# Patient Record
Sex: Male | Born: 1960 | Race: Black or African American | Hispanic: No | Marital: Single | Smoking: Never smoker
Health system: Southern US, Community
[De-identification: ages and names within clinical notes are randomized; demographics above are authoritative.]

## PROBLEM LIST (undated history)

## (undated) DIAGNOSIS — J40 Bronchitis, not specified as acute or chronic: Secondary | ICD-10-CM

## (undated) DIAGNOSIS — K219 Gastro-esophageal reflux disease without esophagitis: Secondary | ICD-10-CM

## (undated) DIAGNOSIS — M1990 Unspecified osteoarthritis, unspecified site: Secondary | ICD-10-CM

## (undated) HISTORY — DX: Unspecified osteoarthritis, unspecified site: M19.90

## (undated) HISTORY — PX: COLONOSCOPY: SHX174

## (undated) HISTORY — PX: POLYPECTOMY: SHX149

---

## 2001-12-14 HISTORY — PX: FOOT SURGERY: SHX648

## 2001-12-14 HISTORY — PX: LEG TENDON SURGERY: SHX1004

## 2004-12-14 HISTORY — PX: EYE SURGERY: SHX253

## 2012-03-07 ENCOUNTER — Encounter: Payer: Self-pay | Admitting: Family Medicine

## 2012-03-07 ENCOUNTER — Ambulatory Visit (INDEPENDENT_AMBULATORY_CARE_PROVIDER_SITE_OTHER): Payer: 59 | Admitting: Family Medicine

## 2012-03-07 VITALS — BP 122/82 | HR 72 | Temp 98.6°F | Resp 12 | Ht 70.5 in | Wt 262.0 lb

## 2012-03-07 DIAGNOSIS — Z0000 Encounter for general adult medical examination without abnormal findings: Secondary | ICD-10-CM

## 2012-03-07 MED ORDER — TADALAFIL 20 MG PO TABS
20.0000 mg | ORAL_TABLET | Freq: Every day | ORAL | Status: AC | PRN
Start: 2012-03-07 — End: 2012-04-06

## 2012-03-07 NOTE — Patient Instructions (Signed)
Consider fasting labs- CBC, BMP, Hepatic panel, TSH, Lipid panel, PSA, testosterone level Consider colonoscopy screening by age 50 and sooner if confirmed family history. Consider tetanus booster.

## 2012-03-07 NOTE — Progress Notes (Signed)
Subjective:    Patient ID: Martin Costa, male    DOB: Oct 29, 1961, 51 y.o.   MRN: [id number]  HPI  New patient to establish care for complete physical. Past medical history reviewed. 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 No prior surgeries. Does not take any regular medications. No known allergies.  Family history reviewed. Possible colon cancer father of patient not sure. Both parents had some arthritis issues but no clear history of inflammatory arthritis.  Patient is single. 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. Nonsmoker. Rare alcohol use.  Last tetanus unknown. No history of prior colonoscopy screening. Patient complains of some erectile dysfunction. Has previously used Viagra. Also has decreased libido and fatigue issues. No recent screening lab work.  Past Medical History  Diagnosis Date  . Arthritis    Past Surgical History  Procedure Date  . Foot surgery 2003    tendon repair    reports that he has never smoked. He does not have any smokeless tobacco history on file. His alcohol and drug histories not on file. family history includes Arthritis in his father and mother. No Known Allergies    Review of Systems  Constitutional: Positive for fatigue. Negative for fever, activity change, appetite change and unexpected weight change (Weight gain but not unexpected).  HENT: Negative for ear pain, congestion and trouble swallowing.   Eyes: Negative for pain and visual disturbance.  Respiratory: Negative for cough, shortness of breath and wheezing.   Cardiovascular: Negative for chest pain and palpitations.  Gastrointestinal: Negative for nausea, vomiting, abdominal pain, diarrhea, constipation, blood in stool, abdominal distention and rectal pain.  Genitourinary: Negative for dysuria, hematuria and testicular pain.  Musculoskeletal: Negative for joint swelling and arthralgias.  Skin: Negative for rash.  Neurological:  Negative for dizziness, syncope, weakness and headaches.  Hematological: Negative for adenopathy.  Psychiatric/Behavioral: Negative for confusion and dysphoric mood. The patient is not nervous/anxious.        Objective:   Physical Exam  Constitutional: He is oriented to person, place, and time. He appears well-developed and well-nourished. No distress.  HENT:  Head: Normocephalic and atraumatic.  Right Ear: External ear normal.  Left Ear: External ear normal.  Mouth/Throat: Oropharynx is clear and moist.  Eyes: Conjunctivae and EOM are normal. Pupils are equal, round, and reactive to light.  Neck: Normal range of motion. Neck supple. No thyromegaly present.  Cardiovascular: Normal rate, regular rhythm and normal heart sounds.   No murmur heard. Pulmonary/Chest: No respiratory distress. He has no wheezes. He has no rales.  Abdominal: Soft. Bowel sounds are normal. He exhibits no distension and no mass. There is no tenderness. There is no rebound and no guarding.  Genitourinary: Rectum normal and prostate normal.  Musculoskeletal: He exhibits no edema.  Lymphadenopathy:    He has no cervical adenopathy.  Neurological: He is alert and oriented to person, place, and time. He displays normal reflexes. No cranial nerve deficit.  Skin: No rash noted.  Psychiatric: He has a normal mood and affect.          Assessment & Plan:  Complete physical. Patient had recent issues with weight gain, fatigue, decreased libido and erectile dysfunction. We have recommended screening lab work. He wishes to see what insurance covers first and is also not fasting today. Consider usual labs including PSA and also testosterone at followup. Also patient given prescription for Cialis 20 mg one every other day as needed. Will need colonoscopy screening soon.  Confirm if father had colon cancer history

## 2012-12-13 ENCOUNTER — Encounter (HOSPITAL_COMMUNITY): Payer: Self-pay | Admitting: Pharmacy Technician

## 2012-12-13 ENCOUNTER — Encounter (HOSPITAL_COMMUNITY): Payer: Self-pay

## 2012-12-13 DIAGNOSIS — S129XXA Fracture of neck, unspecified, initial encounter: Secondary | ICD-10-CM | POA: Diagnosis present

## 2012-12-13 NOTE — H&P (Signed)
History of Present Illness(Martin Costa; 12/13/2012 1:49 PM) The patient is a 51 year old male who presents with neck pain. The patient reports symptoms involving the entire neck which began 6 day(s) ago. The symptoms began following a specific injury. The injury occurred due to hyperextension and blunt force trauma while the patient was while playing basketball. Symptoms include neck pain, neck stiffness, muscle spasm, shoulder pain and headaches. The pain radiates to the left chest, left shoulder, left arm, left upper arm, left forearm, right chest, right shoulder, right arm, right upper arm and right forearm. The patient describes the pain as sharp and aching. Onset was sudden.The patient describes their symptoms as severe.The patient does feel that the symptoms are worsening. Current treatment includes nonsteroidal anti-inflammatory drugs, muscle relaxants, opioid analgesics and rigid cervical collar. Past evaluation has included cervical spine CT, cervical spine MRI and neurology evaluation. The patient states that this is not a Financial risk analyst case. The patient does not have a lawsuit pending in regards to this matter.. The patient does not have an attorney representing them regarding this matter. Note for "Neck pain": He was playing basketball and was struck, he thinks by an elbow in the forehead. This sent him backwards snapping his neck. He did not lose conciousness and he did not strike his head backwards on the pavement. He was sutured in the ER and admitted overnight. He was treated with a hard collar and evaluated by a neurosurgeon. They did recommend surgery, and he wanted to stay with Dr. Shon Baton.    Subjective Transcription(Martin Heinrichs Sheela Stack, MD; 12/13/2012 5:01 PM)  REASON FOR CONSULTATION:  Injury on December 24th causing cervical fracture.    [name] is a very pleasant gentleman who has been in my practice. 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 He states he was in his usual state of health until he was playing basketball and was elbowed directly in the forehead and suffered an acute extension injury to his neck. He subsequently was seen at the hospital because of significant neck pain and was diagnosed based on CT and MRI with a fracture of his cervical spine. He was seen by the neurosurgeon there and he was told that he would require surgery. The patient opted to use be placed into an Aspen collar and presented to me today as he preferred to have me address his care.    The CT scan and MRI were evaluated. The CT scan was from 12/06/12. It does show an anterior fracture line starting at the C5-6 level proceeding through the anterior bridging osteophyte and into the inferior aspect of the C5 vertebral body and into the disc space and exiting through the posterior ossified longitudinal ligament There is also some disruption of the posterior lamina but no widening of the interspinous process ligament. No frank dislocation or perched facets are noted.    The MRI from 12/24 demonstrates fluid within the 5-6 disc space as well as fluid within the 5-6 facet joints. Again no perched or locked facet dislocation is noted. No marrow edema within the pedicle or bony facet structures to indicate posterior fracture involvement. There is on stir sequences defect within the posterior longitudinal ligament as confirmed with the CT scan.    At this point in time I do believe the patient has mild soft tissue edema within the interspinous process spaces at C3-4, C4-5, C5-6 but no edema of the actual processes themselves. There is also focal defect  of the posterior longitudinal ligament at the C5-6 level, widening of the interspinous process distance at the 5-6 level as well.    Medication History(Martin Costa; 12/13/2012 1:39 PM) Methocarbamol (750MG  Tablet, Oral three times daily) Active. No Current Medications:  Inactive. Hydrocodone-Acetaminophen (7.5-500MG  Tablet, Oral three times daily) Active. Ibuprofen (200MG  Capsule, 2 Oral two times daily, as needed) Active.   Objective Transcription(Martin Liberatore Sheela Stack, MD; 12/13/2012 5:01 PM)  On clinical exam the patient is a pleasant gentleman who appears his stated age in no acute distress. He is alert and oriented times three. He has downgoing toes on Babinski testing, no clonus. Negative Hoffmann sign. He has pain inhibition of his deltoid and biceps with strength testing bilaterally but he does have numbness and dysesthesias into the flexor surface of the forearm and into the thumb and index finger predominantly on the left side. He has cervical collar in place. He has neck pain with palpation with the collar still in place. He has no swallowing defects or difficulties. He has a significant scar that has been sutured over his forehead. No visual field defects. No shortness of breath or chest pain. Lungs are clear to auscultation. Abdomen is soft, nontender.    Assessment & Plan(Martin Costa; 12/13/2012 4:06 PM) Fracture, cervical vertebra (805.00) Current Plans l Started Xanax 0.25MG , 1 (one) Tablet PO TID PRN ANXIETY, #15, 5 days starting 12/13/2012, No Refill. 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 The patient suffered a fracture as a result of the injury on 12/24. The patient has both a bony and soft tissue component to the injury.    Plans Transcription(Martin Lavin Sheela Stack, MD; 12/13/2012 5:01 PM)  I did have a long discussion with the patient and his fiance. Had this been an all bony injury, I think he could be successfully treated with a halo brace. We did discuss how a halo was a possible form of treatment, my concern was that  since there is a soft tissue component where it goes through the disc space as evidenced by the MRI that it may not heal and it could become unstable and require surgical intervention at a later date. The other alternative is to do a posterior based fusion with lateral mass screw fixation. This would be either C3 or C4 to C7 or T1/T2. This would give proximal and distal fixation and allow for a fusion mass to maintain stability. I think this provides the best surgical option. Anteriorly I would have to dissect through the very extensive amount of the DISH which I would think would cause greater complication. At this point I think the best course of action is a posterior instrumented fusion. The risks of surgery as I have explained them to the patient and to his fiance include infection, bleeding, nerve damage, death, stroke, paralysis, failure to heal, need for further surgery, ongoing or worse pain, failure of fracture reduction and maintenance, need for halo immobilization. At this point all his questions were addressed. We will plan on surgery for this Thursday. He will continue to maintain strict Aspen collar use.

## 2012-12-14 MED ORDER — CEFAZOLIN SODIUM-DEXTROSE 2-3 GM-% IV SOLR
2.0000 g | INTRAVENOUS | Status: AC
  Administered 2012-12-15: 2 g via INTRAVENOUS
  Filled 2012-12-14 (×2): qty 50

## 2012-12-15 ENCOUNTER — Encounter (HOSPITAL_COMMUNITY): Payer: Self-pay | Admitting: *Deleted

## 2012-12-15 ENCOUNTER — Encounter (HOSPITAL_COMMUNITY): Payer: Self-pay | Admitting: Certified Registered"

## 2012-12-15 ENCOUNTER — Ambulatory Visit (HOSPITAL_COMMUNITY): Payer: BC Managed Care – PPO | Admitting: Certified Registered"

## 2012-12-15 ENCOUNTER — Encounter (HOSPITAL_COMMUNITY)
Admission: RE | Disposition: A | Discharge status: Home / Self Care | Payer: Self-pay | Source: Ambulatory Visit | Attending: Orthopedic Surgery

## 2012-12-15 ENCOUNTER — Ambulatory Visit (HOSPITAL_COMMUNITY): Payer: BC Managed Care – PPO

## 2012-12-15 ENCOUNTER — Observation Stay (HOSPITAL_COMMUNITY)
Admission: RE | Admit: 2012-12-15 | Discharge: 2012-12-18 | Disposition: A | Discharge status: Home / Self Care | Payer: BC Managed Care – PPO | Source: Ambulatory Visit | Attending: Orthopedic Surgery | Admitting: Orthopedic Surgery

## 2012-12-15 DIAGNOSIS — Y9367 Activity, basketball: Secondary | ICD-10-CM | POA: Insufficient documentation

## 2012-12-15 DIAGNOSIS — S129XXA Fracture of neck, unspecified, initial encounter: Secondary | ICD-10-CM | POA: Diagnosis present

## 2012-12-15 DIAGNOSIS — M5380 Other specified dorsopathies, site unspecified: Principal | ICD-10-CM | POA: Insufficient documentation

## 2012-12-15 DIAGNOSIS — Y9239 Other specified sports and athletic area as the place of occurrence of the external cause: Secondary | ICD-10-CM | POA: Insufficient documentation

## 2012-12-15 DIAGNOSIS — S12400A Unspecified displaced fracture of fifth cervical vertebra, initial encounter for closed fracture: Secondary | ICD-10-CM | POA: Insufficient documentation

## 2012-12-15 DIAGNOSIS — IMO0002 Reserved for concepts with insufficient information to code with codable children: Secondary | ICD-10-CM | POA: Insufficient documentation

## 2012-12-15 DIAGNOSIS — Y92838 Other recreation area as the place of occurrence of the external cause: Secondary | ICD-10-CM | POA: Insufficient documentation

## 2012-12-15 HISTORY — DX: Bronchitis, not specified as acute or chronic: J40

## 2012-12-15 HISTORY — DX: Gastro-esophageal reflux disease without esophagitis: K21.9

## 2012-12-15 HISTORY — PX: POSTERIOR CERVICAL FUSION/FORAMINOTOMY: SHX5038

## 2012-12-15 LAB — CBC
HCT: 40.6 % (ref 39.0–52.0)
Hemoglobin: 14 g/dL (ref 13.0–17.0)
MCV: 89.2 fL (ref 78.0–100.0)
RDW: 14.1 % (ref 11.5–15.5)
WBC: 7.6 10*3/uL (ref 4.0–10.5)

## 2012-12-15 LAB — BASIC METABOLIC PANEL
BUN: 21 mg/dL (ref 6–23)
CO2: 24 mEq/L (ref 19–32)
Chloride: 103 mEq/L (ref 96–112)
Creatinine, Ser: 1.15 mg/dL (ref 0.50–1.35)
Glucose, Bld: 81 mg/dL (ref 70–99)

## 2012-12-15 SURGERY — POSTERIOR CERVICAL FUSION/FORAMINOTOMY LEVEL 3
Anesthesia: General | Site: Spine Cervical | Wound class: Clean

## 2012-12-15 MED ORDER — METHOCARBAMOL 100 MG/ML IJ SOLN
500.0000 mg | Freq: Four times a day (QID) | INTRAVENOUS | Status: DC | PRN
  Administered 2012-12-15: 500 mg via INTRAVENOUS

## 2012-12-15 MED ORDER — ACETAMINOPHEN 10 MG/ML IV SOLN
1000.0000 mg | Freq: Once | INTRAVENOUS | Status: AC
  Administered 2012-12-15: 1000 mg via INTRAVENOUS

## 2012-12-15 MED ORDER — COCAINE HCL 4 % EX SOLN
4.0000 mL | Freq: Once | CUTANEOUS | Status: AC
  Administered 2012-12-15: 4 mL via NASAL

## 2012-12-15 MED ORDER — FENTANYL CITRATE 0.05 MG/ML IJ SOLN
INTRAMUSCULAR | Status: AC
  Filled 2012-12-15: qty 2

## 2012-12-15 MED ORDER — LACTATED RINGERS IV SOLN
INTRAVENOUS | Status: DC | PRN
  Administered 2012-12-15 (×2): via INTRAVENOUS

## 2012-12-15 MED ORDER — LACTATED RINGERS IV SOLN
INTRAVENOUS | Status: DC
  Administered 2012-12-15: 14:00:00 via INTRAVENOUS

## 2012-12-15 MED ORDER — ONDANSETRON HCL 4 MG/2ML IJ SOLN: 4.0000 mg | Freq: Four times a day (QID) | INTRAMUSCULAR | Status: DC | PRN

## 2012-12-15 MED ORDER — PHENYLEPHRINE HCL 10 MG/ML IJ SOLN
10.0000 mg | INTRAVENOUS | Status: DC | PRN
  Administered 2012-12-15: 10 ug/min via INTRAVENOUS

## 2012-12-15 MED ORDER — 0.9 % SODIUM CHLORIDE (POUR BTL) OPTIME
TOPICAL | Status: DC | PRN
  Administered 2012-12-15: 1000 mL

## 2012-12-15 MED ORDER — PROPOFOL 10 MG/ML IV BOLUS
INTRAVENOUS | Status: DC | PRN
  Administered 2012-12-15: 30 mg via INTRAVENOUS
  Administered 2012-12-15: 200 mg via INTRAVENOUS

## 2012-12-15 MED ORDER — BACITRACIN ZINC 500 UNIT/GM EX OINT
TOPICAL_OINTMENT | CUTANEOUS | Status: AC
  Filled 2012-12-15: qty 15

## 2012-12-15 MED ORDER — PROPOFOL INFUSION 10 MG/ML OPTIME
INTRAVENOUS | Status: DC | PRN
  Administered 2012-12-15: 100 ug/kg/min via INTRAVENOUS
  Administered 2012-12-15: 17:00:00 via INTRAVENOUS

## 2012-12-15 MED ORDER — ACETAMINOPHEN 10 MG/ML IV SOLN
INTRAVENOUS | Status: AC
  Filled 2012-12-15: qty 100

## 2012-12-15 MED ORDER — MUPIROCIN 2 % EX OINT
TOPICAL_OINTMENT | Freq: Two times a day (BID) | CUTANEOUS | Status: DC
  Administered 2012-12-15: 1 via NASAL
  Administered 2012-12-16: 04:00:00 via NASAL

## 2012-12-15 MED ORDER — HYDROMORPHONE HCL PF 1 MG/ML IJ SOLN
INTRAMUSCULAR | Status: DC | PRN
  Administered 2012-12-15: .4 mg via INTRAVENOUS
  Administered 2012-12-15: .6 mg via INTRAVENOUS

## 2012-12-15 MED ORDER — LACTATED RINGERS IV SOLN
INTRAVENOUS | Status: DC
Start: 2012-12-15 — End: 2012-12-18

## 2012-12-15 MED ORDER — OXYMETAZOLINE HCL 0.05 % NA SOLN
NASAL | Status: AC
  Filled 2012-12-15: qty 15

## 2012-12-15 MED ORDER — THROMBIN 20000 UNITS EX SOLR
CUTANEOUS | Status: DC | PRN
  Administered 2012-12-15: 17:00:00 via TOPICAL

## 2012-12-15 MED ORDER — DIPHENHYDRAMINE HCL 12.5 MG/5ML PO ELIX: 12.5000 mg | ORAL_SOLUTION | Freq: Four times a day (QID) | ORAL | Status: DC | PRN

## 2012-12-15 MED ORDER — DEXTROSE 5 % IV SOLN
500.0000 mg | INTRAVENOUS | Status: DC
  Filled 2012-12-15: qty 5

## 2012-12-15 MED ORDER — VANCOMYCIN HCL 500 MG IV SOLR
INTRAVENOUS | Status: DC | PRN
  Administered 2012-12-15: 500 mg

## 2012-12-15 MED ORDER — HYDROMORPHONE 0.3 MG/ML IV SOLN
INTRAVENOUS | Status: AC
  Filled 2012-12-15: qty 25

## 2012-12-15 MED ORDER — THROMBIN 20000 UNITS EX SOLR
CUTANEOUS | Status: AC
  Filled 2012-12-15: qty 20000

## 2012-12-15 MED ORDER — MIDAZOLAM HCL 2 MG/2ML IJ SOLN
INTRAMUSCULAR | Status: AC
  Filled 2012-12-15: qty 2

## 2012-12-15 MED ORDER — MIDAZOLAM HCL 5 MG/5ML IJ SOLN
INTRAMUSCULAR | Status: DC | PRN
  Administered 2012-12-15 (×2): 1 mg via INTRAVENOUS

## 2012-12-15 MED ORDER — DIPHENHYDRAMINE HCL 50 MG/ML IJ SOLN: 12.5000 mg | Freq: Four times a day (QID) | INTRAMUSCULAR | Status: DC | PRN

## 2012-12-15 MED ORDER — DEXAMETHASONE SODIUM PHOSPHATE 10 MG/ML IJ SOLN
10.0000 mg | Freq: Once | INTRAMUSCULAR | Status: AC
  Administered 2012-12-15: 10 mg via INTRAVENOUS
  Filled 2012-12-15 (×2): qty 1

## 2012-12-15 MED ORDER — ACETAMINOPHEN 10 MG/ML IV SOLN
1000.0000 mg | Freq: Four times a day (QID) | INTRAVENOUS | Status: AC
  Administered 2012-12-16 (×4): 1000 mg via INTRAVENOUS
  Filled 2012-12-15 (×4): qty 100

## 2012-12-15 MED ORDER — VANCOMYCIN HCL 500 MG IV SOLR
INTRAVENOUS | Status: AC
  Filled 2012-12-15: qty 500

## 2012-12-15 MED ORDER — SODIUM CHLORIDE 0.9 % IJ SOLN: 9.0000 mL | INTRAMUSCULAR | Status: DC | PRN

## 2012-12-15 MED ORDER — COCAINE HCL 4 % EX SOLN
CUTANEOUS | Status: AC
  Filled 2012-12-15: qty 4

## 2012-12-15 MED ORDER — BUPIVACAINE-EPINEPHRINE 0.25% -1:200000 IJ SOLN
INTRAMUSCULAR | Status: AC
  Filled 2012-12-15: qty 1

## 2012-12-15 MED ORDER — FENTANYL CITRATE 0.05 MG/ML IJ SOLN
50.0000 ug | Freq: Once | INTRAMUSCULAR | Status: AC
  Administered 2012-12-15: 50 ug via INTRAVENOUS

## 2012-12-15 MED ORDER — MIDAZOLAM HCL 2 MG/2ML IJ SOLN
1.0000 mg | INTRAMUSCULAR | Status: DC | PRN
  Administered 2012-12-15: 1 mg via INTRAVENOUS

## 2012-12-15 MED ORDER — METHOCARBAMOL 500 MG PO TABS
500.0000 mg | ORAL_TABLET | Freq: Four times a day (QID) | ORAL | Status: DC | PRN
  Administered 2012-12-16 (×2): 500 mg via ORAL
  Filled 2012-12-15 (×3): qty 1

## 2012-12-15 MED ORDER — BUPIVACAINE-EPINEPHRINE 0.25% -1:200000 IJ SOLN
INTRAMUSCULAR | Status: DC | PRN
  Administered 2012-12-15: 10 mL

## 2012-12-15 MED ORDER — FENTANYL CITRATE 0.05 MG/ML IJ SOLN
INTRAMUSCULAR | Status: DC | PRN
  Administered 2012-12-15: 50 ug via INTRAVENOUS
  Administered 2012-12-15: 100 ug via INTRAVENOUS
  Administered 2012-12-15 (×3): 50 ug via INTRAVENOUS
  Administered 2012-12-15: 100 ug via INTRAVENOUS
  Administered 2012-12-15 (×2): 50 ug via INTRAVENOUS

## 2012-12-15 MED ORDER — MUPIROCIN 2 % EX OINT
TOPICAL_OINTMENT | CUTANEOUS | Status: AC
  Administered 2012-12-15: 1 via NASAL
  Filled 2012-12-15: qty 22

## 2012-12-15 MED ORDER — HYDROMORPHONE HCL PF 1 MG/ML IJ SOLN
INTRAMUSCULAR | Status: AC
  Filled 2012-12-15: qty 1

## 2012-12-15 MED ORDER — HYDROMORPHONE 0.3 MG/ML IV SOLN
INTRAVENOUS | Status: DC
  Administered 2012-12-15: 21:00:00 via INTRAVENOUS

## 2012-12-15 MED ORDER — NALOXONE HCL 0.4 MG/ML IJ SOLN: 0.4000 mg | INTRAMUSCULAR | Status: DC | PRN

## 2012-12-15 SURGICAL SUPPLY — 83 items
BIT DRILL MOUNTAINEER 16MM (BIT) ×1 IMPLANT
BIT DRILL MOUNTAINEER FIX 14 (BIT) ×1
BIT DRILL MOUNTAINEER FIX 14MM (BIT) ×1 IMPLANT
BIT DRILL NEURO 2X3.1 SFT TUCH (MISCELLANEOUS) ×2 IMPLANT
BLADE SURG ROTATE 9660 (MISCELLANEOUS) IMPLANT
BONE CHIP PRESERV 20CC (Bone Implant) ×2 IMPLANT
BUR SURG 4X8 MED (BURR) ×1 IMPLANT
BURR SURG 4X8 MED (BURR) ×2
CLOSURE STERI-STRIP 1/4X4 (GAUZE/BANDAGES/DRESSINGS) ×2 IMPLANT
CLOTH BEACON ORANGE TIMEOUT ST (SAFETY) ×2 IMPLANT
CORDS BIPOLAR (ELECTRODE) ×2 IMPLANT
COVER MAYO STAND STRL (DRAPES) ×6 IMPLANT
COVER SURGICAL LIGHT HANDLE (MISCELLANEOUS) ×4 IMPLANT
DERMABOND ADVANCED (GAUZE/BANDAGES/DRESSINGS) ×1
DERMABOND ADVANCED .7 DNX12 (GAUZE/BANDAGES/DRESSINGS) ×1 IMPLANT
DRAPE C-ARM 42X72 X-RAY (DRAPES) ×4 IMPLANT
DRAPE POUCH INSTRU U-SHP 10X18 (DRAPES) ×2 IMPLANT
DRAPE SURG 17X23 STRL (DRAPES) ×2 IMPLANT
DRAPE U-SHAPE 47X51 STRL (DRAPES) ×2 IMPLANT
DRILL BIT MOUNTAINEER 16MM (BIT) ×2
DRILL BIT MOUNTAINEER FIX 14MM (BIT) ×1
DRILL NEURO 2X3.1 SOFT TOUCH (MISCELLANEOUS) ×4
DRSG ADAPTIC 3X8 NADH LF (GAUZE/BANDAGES/DRESSINGS) ×2 IMPLANT
DRSG MEPILEX BORDER 4X8 (GAUZE/BANDAGES/DRESSINGS) ×2 IMPLANT
DURAPREP 26ML APPLICATOR (WOUND CARE) ×2 IMPLANT
ELECT BLADE 4.0 EZ CLEAN MEGAD (MISCELLANEOUS)
ELECT BLADE 6.5 EXT (BLADE) ×2 IMPLANT
ELECT CAUTERY BLADE 6.4 (BLADE) ×2 IMPLANT
ELECT REM PT RETURN 9FT ADLT (ELECTROSURGICAL) ×2
ELECTRODE BLDE 4.0 EZ CLN MEGD (MISCELLANEOUS) IMPLANT
ELECTRODE REM PT RTRN 9FT ADLT (ELECTROSURGICAL) ×1 IMPLANT
EVACUATOR 1/8 PVC DRAIN (DRAIN) IMPLANT
GLOVE BIOGEL PI IND STRL 6.5 (GLOVE) ×1 IMPLANT
GLOVE BIOGEL PI IND STRL 8.5 (GLOVE) ×1 IMPLANT
GLOVE BIOGEL PI INDICATOR 6.5 (GLOVE) ×1
GLOVE BIOGEL PI INDICATOR 8.5 (GLOVE) ×1
GLOVE ECLIPSE 6.0 STRL STRAW (GLOVE) ×2 IMPLANT
GLOVE ECLIPSE 8.5 STRL (GLOVE) ×2 IMPLANT
GOWN PREVENTION PLUS XXLARGE (GOWN DISPOSABLE) ×2 IMPLANT
GOWN STRL NON-REIN LRG LVL3 (GOWN DISPOSABLE) ×4 IMPLANT
IV CATH 14GX2 1/4 (CATHETERS) IMPLANT
KIT BASIN OR (CUSTOM PROCEDURE TRAY) ×2 IMPLANT
KIT POSITION SURG JACKSON T1 (MISCELLANEOUS) ×2 IMPLANT
KIT ROOM TURNOVER OR (KITS) ×2 IMPLANT
KIT STIMULAN RAPID CURE 5CC (Orthopedic Implant) ×2 IMPLANT
NEEDLE 22X1 1/2 (OR ONLY) (NEEDLE) ×2 IMPLANT
NS IRRIG 1000ML POUR BTL (IV SOLUTION) ×2 IMPLANT
NUT HH X CONN OUTER (Orthopedic Implant) ×4 IMPLANT
PACK LAMINECTOMY ORTHO (CUSTOM PROCEDURE TRAY) ×2 IMPLANT
PACK UNIVERSAL I (CUSTOM PROCEDURE TRAY) ×2 IMPLANT
PAD ARMBOARD 7.5X6 YLW CONV (MISCELLANEOUS) ×4 IMPLANT
PATTIES SURGICAL .5 X.5 (GAUZE/BANDAGES/DRESSINGS) IMPLANT
PATTIES SURGICAL .5 X1 (DISPOSABLE) ×2 IMPLANT
PIN MAYFIELD SKULL DISP (PIN) ×2 IMPLANT
PLATE HH X CONN 28MM (Plate) ×2 IMPLANT
PUTTY OP1 (Orthopedic Implant) ×2 IMPLANT
ROD MOUTAINEER 3.5X200MM (Rod) ×2 IMPLANT
SCREW F A 3.5X14 (Screw) ×12 IMPLANT
SCREW HH X CONN INNER (Screw) ×4 IMPLANT
SCREW INNER (Screw) ×16 IMPLANT
SCREW M L 3.5X14 (Screw) ×4 IMPLANT
SCREW MOUNTAINEER M/L 4.35X25 (Screw) ×4 IMPLANT
SPONGE GAUZE 4X4 12PLY (GAUZE/BANDAGES/DRESSINGS) ×2 IMPLANT
SPONGE LAP 4X18 X RAY DECT (DISPOSABLE) ×4 IMPLANT
STAPLER VISISTAT (STAPLE) ×2 IMPLANT
STRIP CLOSURE SKIN 1/2X4 (GAUZE/BANDAGES/DRESSINGS) ×4 IMPLANT
SURGIFLO TRUKIT (HEMOSTASIS) ×2 IMPLANT
SURGILUBE 2OZ TUBE FLIPTOP (MISCELLANEOUS) IMPLANT
SUT ETHILON 3 0 FSL (SUTURE) IMPLANT
SUT PDS AB 1 CTX 36 (SUTURE) ×2 IMPLANT
SUT PROLENE 0 CT (SUTURE) ×2 IMPLANT
SUT PROLENE 2 0 CT2 30 (SUTURE) ×2 IMPLANT
SUT VIC AB 0 CTB1 27 (SUTURE) ×4 IMPLANT
SUT VIC AB 1 CTX 36 (SUTURE) ×3
SUT VIC AB 1 CTX36XBRD ANBCTR (SUTURE) ×3 IMPLANT
SUT VIC AB 2-0 CT1 18 (SUTURE) ×2 IMPLANT
SYR BULB IRRIGATION 50ML (SYRINGE) ×2 IMPLANT
SYR CONTROL 10ML LL (SYRINGE) ×2 IMPLANT
TOWEL OR 17X24 6PK STRL BLUE (TOWEL DISPOSABLE) ×2 IMPLANT
TOWEL OR 17X26 10 PK STRL BLUE (TOWEL DISPOSABLE) ×2 IMPLANT
TRAY FOLEY CATH 14FR (SET/KITS/TRAYS/PACK) ×2 IMPLANT
WATER STERILE IRR 1000ML POUR (IV SOLUTION) ×2 IMPLANT
YANKAUER SUCT BULB TIP NO VENT (SUCTIONS) ×2 IMPLANT

## 2012-12-15 NOTE — Transfer of Care (Signed)
Immediate Anesthesia Transfer of Care Note  Patient: Martin Costa  Procedure(s) Performed: Procedure(s) (LRB) with comments: POSTERIOR CERVICAL FUSION/FORAMINOTOMY LEVEL 3 (N/A) - POSTERIOR CERVICAL FUSION CERVICAL FOUR TO THORACIC TWO  Patient Location: PACU  Anesthesia Type:General  Level of Consciousness: sedated  Airway & Oxygen Therapy: Patient Spontanous Breathing and Patient connected to face mask oxygen  Post-op Assessment: Report given to PACU RN and Post -op Vital signs reviewed and stable  Post vital signs: Reviewed and stable  Complications: No apparent anesthesia complications

## 2012-12-15 NOTE — H&P (Signed)
No change in clinical exam Plan on C4-T1/T2 fusion. H+P reviewed

## 2012-12-15 NOTE — Anesthesia Preprocedure Evaluation (Addendum)
Anesthesia Evaluation  Patient identified by MRN, date of birth, ID band Patient awake    Reviewed: H&P , NPO status , Patient's Chart, lab work & pertinent test results  Airway Mallampati: II  Neck ROM: Limited  Mouth opening: Limited Mouth Opening  Dental  (+) Teeth Intact and Dental Advisory Given   Pulmonary  breath sounds clear to auscultation        Cardiovascular Rhythm:Regular Rate:Normal     Neuro/Psych    GI/Hepatic   Endo/Other    Renal/GU      Musculoskeletal   Abdominal   Peds  Hematology   Anesthesia Other Findings   Reproductive/Obstetrics                          Anesthesia Physical Anesthesia Plan  ASA: II  Anesthesia Plan: General   Post-op Pain Management:    Induction: Intravenous  Airway Management Planned: Nasal ETT, Awake Intubation Planned and Fiberoptic Intubation Planned  Additional Equipment:   Intra-op Plan:   Post-operative Plan: Extubation in OR  Informed Consent: I have reviewed the patients History and Physical, chart, labs and discussed the procedure including the risks, benefits and alternatives for the proposed anesthesia with the patient or authorized representative who has indicated his/her understanding and acceptance.   Dental advisory given  Plan Discussed with: CRNA and Surgeon  Anesthesia Plan Comments: (52 year old male with DISH and fracture at C5-6, neuro intact with potential unstable C-spine. Plan awake nasal fiberoptic intubation. Procedures explained, questions answered.  Kipp Brood, MD)      Anesthesia Quick Evaluation

## 2012-12-15 NOTE — Brief Op Note (Signed)
12/15/2012  7:58 PM  PATIENT:  Martin Costa  52 y.o. male  PRE-OPERATIVE DIAGNOSIS:  C 4-7 CERVICAL FRACTURE  POST-OPERATIVE DIAGNOSIS:  C 4-7 CERVICAL FRACTURE  PROCEDURE:  Procedure(s) (LRB) with comments: POSTERIOR CERVICAL FUSION/FORAMINOTOMY LEVEL 3 (N/A) - POSTERIOR CERVICAL FUSION CERVICAL FOUR TO THORACIC TWO  SURGEON:  Surgeon(s) and Role:    * Venita Lick, MD - Primary  PHYSICIAN ASSISTANT:   ASSISTANTS: none   ANESTHESIA:   general  EBL:     BLOOD ADMINISTERED:none  DRAINS: none   LOCAL MEDICATIONS USED:  MARCAINE     SPECIMEN:  No Specimen  DISPOSITION OF SPECIMEN:  N/A  COUNTS:  YES  TOURNIQUET:  * No tourniquets in log *  DICTATION: .Other Dictation: Dictation Number [id number]  PLAN OF CARE: Admit to inpatient   PATIENT DISPOSITION:  PACU - hemodynamically stable.

## 2012-12-15 NOTE — Preoperative (Signed)
Beta Blockers   Reason not to administer Beta Blockers:Not Applicable 

## 2012-12-15 NOTE — Anesthesia Postprocedure Evaluation (Signed)
  Anesthesia Post-op Note  Patient: Martin Costa  Procedure(s) Performed: Procedure(s) (LRB) with comments: POSTERIOR CERVICAL FUSION/FORAMINOTOMY LEVEL 3 (N/A) - POSTERIOR CERVICAL FUSION CERVICAL FOUR TO THORACIC TWO  Patient Location: PACU  Anesthesia Type:General  Level of Consciousness: awake, sedated and patient cooperative  Airway and Oxygen Therapy: Patient Spontanous Breathing  Post-op Pain: mild  Post-op Assessment: Post-op Vital signs reviewed, Patient's Cardiovascular Status Stable, Respiratory Function Stable, Patent Airway, No signs of Nausea or vomiting and Pain level controlled  Post-op Vital Signs: stable  Complications: No apparent anesthesia complications

## 2012-12-16 ENCOUNTER — Observation Stay (HOSPITAL_COMMUNITY): Payer: BC Managed Care – PPO

## 2012-12-16 MED ORDER — DIAZEPAM 2 MG PO TABS
2.0000 mg | ORAL_TABLET | Freq: Three times a day (TID) | ORAL | Status: DC | PRN
  Administered 2012-12-16 – 2012-12-18 (×4): 2 mg via ORAL
  Filled 2012-12-16 (×4): qty 1

## 2012-12-16 MED ORDER — OXYCODONE HCL 5 MG PO TABS
10.0000 mg | ORAL_TABLET | ORAL | Status: DC | PRN
  Administered 2012-12-16: 10 mg via ORAL
  Filled 2012-12-16: qty 2

## 2012-12-16 MED ORDER — SODIUM CHLORIDE 0.9 % IV SOLN: 250.0000 mL | INTRAVENOUS | Status: DC

## 2012-12-16 MED ORDER — DEXAMETHASONE 4 MG PO TABS
4.0000 mg | ORAL_TABLET | Freq: Four times a day (QID) | ORAL | Status: DC
  Administered 2012-12-16 – 2012-12-18 (×10): 4 mg via ORAL
  Filled 2012-12-16 (×13): qty 1

## 2012-12-16 MED ORDER — ONDANSETRON HCL 4 MG/2ML IJ SOLN: 4.0000 mg | INTRAMUSCULAR | Status: DC | PRN

## 2012-12-16 MED ORDER — CEFAZOLIN SODIUM 1-5 GM-% IV SOLN
1.0000 g | Freq: Three times a day (TID) | INTRAVENOUS | Status: AC
  Administered 2012-12-16 (×2): 1 g via INTRAVENOUS
  Filled 2012-12-16 (×2): qty 50

## 2012-12-16 MED ORDER — DEXAMETHASONE SODIUM PHOSPHATE 4 MG/ML IJ SOLN
4.0000 mg | Freq: Four times a day (QID) | INTRAMUSCULAR | Status: DC
  Filled 2012-12-16 (×13): qty 1

## 2012-12-16 MED ORDER — FLEET ENEMA 7-19 GM/118ML RE ENEM: 1.0000 | ENEMA | Freq: Once | RECTAL | Status: AC | PRN

## 2012-12-16 MED ORDER — OXYCODONE HCL 5 MG PO TABS
10.0000 mg | ORAL_TABLET | ORAL | Status: DC | PRN
  Administered 2012-12-16 – 2012-12-18 (×10): 10 mg via ORAL
  Filled 2012-12-16 (×10): qty 2

## 2012-12-16 MED ORDER — MENTHOL 3 MG MT LOZG: 1.0000 | LOZENGE | OROMUCOSAL | Status: DC | PRN

## 2012-12-16 MED ORDER — OXYCODONE HCL ER 15 MG PO T12A
15.0000 mg | EXTENDED_RELEASE_TABLET | Freq: Two times a day (BID) | ORAL | Status: DC
  Administered 2012-12-16 – 2012-12-18 (×5): 15 mg via ORAL
  Filled 2012-12-16 (×5): qty 1

## 2012-12-16 MED ORDER — ZOLPIDEM TARTRATE 5 MG PO TABS
5.0000 mg | ORAL_TABLET | Freq: Every evening | ORAL | Status: DC | PRN
  Administered 2012-12-16: 5 mg via ORAL
  Filled 2012-12-16: qty 1

## 2012-12-16 MED ORDER — PHENOL 1.4 % MT LIQD: 1.0000 | OROMUCOSAL | Status: DC | PRN

## 2012-12-16 MED ORDER — SODIUM CHLORIDE 0.9 % IJ SOLN
3.0000 mL | Freq: Two times a day (BID) | INTRAMUSCULAR | Status: DC
  Administered 2012-12-16 (×2): 3 mL via INTRAVENOUS

## 2012-12-16 MED ORDER — DOCUSATE SODIUM 100 MG PO CAPS
100.0000 mg | ORAL_CAPSULE | Freq: Two times a day (BID) | ORAL | Status: DC
  Administered 2012-12-16 – 2012-12-18 (×5): 100 mg via ORAL
  Filled 2012-12-16 (×7): qty 1

## 2012-12-16 MED ORDER — SODIUM CHLORIDE 0.9 % IJ SOLN: 3.0000 mL | INTRAMUSCULAR | Status: DC | PRN

## 2012-12-16 NOTE — Progress Notes (Signed)
Orthopedic Tech Progress Note Patient Details:  Martin Costa 1961-10-20 [id number] Brace order completed by bio-tech vendor Joey at 3:43 pm. Patient ID: [name], male   DOB: 27-Nov-1961, 53 y.o.   MRN: [id number]   Jennye Moccasin 12/16/2012, 4:26 PM

## 2012-12-16 NOTE — Progress Notes (Signed)
Orthopedic Tech Progress Note Patient Details:  Martin Costa October 12, 1961 [id number] Called bio-tech for aspen collar. Patient ID: Mohab Ashby, male   DOB: 02-22-1961, 52 y.o.   MRN: [id number]   Jennye Moccasin 12/16/2012, 3:41 PM

## 2012-12-16 NOTE — Evaluation (Signed)
Physical Therapy Evaluation Patient Details Name: Martin Costa MRN: [id number] DOB: 27-May-1961 Today's Date: 12/16/2012 Time: 1040-1110 PT Time Calculation (min): 30 min  PT Assessment / Plan / Recommendation Clinical Impression  52 yo male s/p C4-T1 posterior fusion that would benefit from PT in acute setting to maximize functional mobility for safe d/c to home.   Pt mobility limited by pain at this time.  Pt pain mostly in bilateral upper traps.  Pt also presents with edema in bilateral upper traps and tenderness to light palpation.  Applied ice packs to both shoulders and ordered gel ice pack from OR.  Acute PT to follow pt.       PT Assessment  Patient needs continued PT services    Follow Up Recommendations  Home health PT;Supervision - Intermittent    Does the patient have the potential to tolerate intense rehabilitation      Barriers to Discharge None      Equipment Recommendations  Rolling walker with 5" wheels    Recommendations for Other Services     Frequency Min 5X/week    Precautions / Restrictions Precautions Precautions: Cervical Precaution Comments: Educated pt on cervical precautions and use of hard collar.  Pt reports wanting to keep collar on all the time for fear of injuring his neck. Attempted to reassure pt that sleeping in brace was not necessary.   Required Braces or Orthoses: Cervical Brace Cervical Brace: Hard collar;Applied in supine position Restrictions Weight Bearing Restrictions: No   Pertinent Vitals/Pain 10/10 pain in bilateral Upper traps. Pt medicated 30+min prior to session. Applied ice to bilateral shoulders for pain and edema reduction.       Mobility  Bed Mobility Bed Mobility: Sit to Supine Supine to Sit: 3: Mod assist;HOB elevated Sitting - Scoot to Edge of Bed: 4: Min assist Sit to Supine: 5: Supervision;HOB flat Details for Bed Mobility Assistance: Attempted to instruct pt in sit-side-supine transfer, but pt unable to tolerate. Pt  transitioned to supine from semi-sidelying position.  Pt able to complete task without assistance but c/o severe pain.  Transfers Transfers: Sit to Stand;Stand to [employer] Transfers Sit to Stand: 4: Min guard;From chair/3-in-1;With upper extremity assist Stand to Sit: 4: Min guard;To bed;With upper extremity assist Stand Pivot Transfers: 5: Supervision Details for Transfer Assistance: educated on hand placement Ambulation/Gait Ambulation/Gait Assistance: Not tested (comment) General Gait Details: Pt declined secondary to pain.      Shoulder Instructions     Exercises     PT Diagnosis: Acute pain;Generalized weakness  PT Problem List: Decreased strength;Decreased activity tolerance;Decreased mobility;Decreased knowledge of use of DME;Decreased knowledge of precautions;Pain PT Treatment Interventions: Gait training;Stair training;Functional mobility training;Therapeutic activities;Therapeutic exercise;DME instruction;Patient/family education   PT Goals Acute Rehab PT Goals PT Goal Formulation: With patient Time For Goal Achievement: 12/23/12 Potential to Achieve Goals: Good Pt will Roll Supine to Right Side: with modified independence PT Goal: Rolling Supine to Right Side - Progress: Goal set today Pt will go Supine/Side to Sit: with modified independence PT Goal: Supine/Side to Sit - Progress: Goal set today Pt will go Sit to Supine/Side: with modified independence PT Goal: Sit to Supine/Side - Progress: Goal set today Pt will go Sit to Stand: with modified independence PT Goal: Sit to Stand - Progress: Goal set today Pt will go Stand to Sit: with modified independence PT Goal: Stand to Sit - Progress: Goal set today Pt will Ambulate: 51 - 150 feet;with supervision;with least restrictive assistive device PT Goal: Ambulate - Progress:  Goal set today Pt will Go Up / Down Stairs: Flight;with supervision;with least restrictive assistive device PT Goal: Up/Down Stairs -  Progress: Goal set today Additional Goals Additional Goal #1: Pt will recall and demonstrate 3/3 cervical precautions.  PT Goal: Additional Goal #1 - Progress: Goal set today  Visit Information  Last PT Received On: 12/16/12 Assistance Needed: +1    Subjective Data  Subjective: It's not going to happen today. I am in too much pain and sleepy Patient Stated Goal: none stated.    Prior Functioning  Home Living Lives With: Alone Available Help at Discharge: Family Type of Home: Apartment Home Access: Stairs to enter Secretary/administrator of Steps: 15 Entrance Stairs-Rails: Can reach both Home Layout: One level Bathroom Shower/Tub: Forensic scientist: Standard Bathroom Accessibility: Yes How Accessible: Accessible via walker Home Adaptive Equipment: None Prior Function Level of Independence: Independent Able to Take Stairs?: Yes Driving: Yes Vocation: Full time employment Comments: Proofreader -  Communication Communication: No difficulties Dominant Hand: Right    Cognition  Overall Cognitive Status: Appears within functional limits for tasks assessed/performed Arousal/Alertness: Awake/alert Orientation Level: Appears intact for tasks assessed Behavior During Session: [facility] for tasks performed    Extremity/Trunk Assessment Right Upper Extremity Assessment RUE ROM/Strength/Tone: Deficits;Due to pain RUE ROM/Strength/Tone Deficits: ARom ~30 degrees shoulder flexion, 10 degrees abduction shoulder RUE Sensation: WFL - Light Touch RUE Coordination: WFL - gross motor Left Upper Extremity Assessment LUE ROM/Strength/Tone: Deficits;Due to pain LUE ROM/Strength/Tone Deficits: ARom ~30 degrees shoulder flexion, 10 degrees abduction shoulder LUE Sensation: WFL - Light Touch LUE Coordination: WFL - gross motor Right Lower Extremity Assessment RLE ROM/Strength/Tone: Within functional levels RLE Sensation: WFL - Light Touch;WFL - Proprioception Left Lower  Extremity Assessment LLE ROM/Strength/Tone: Within functional levels LLE Sensation: WFL - Light Touch;WFL - Proprioception Trunk Assessment Trunk Assessment: Normal   Balance Balance Balance Assessed: Yes Static Sitting Balance Static Sitting - Balance Support: Feet supported Static Sitting - Level of Assistance: 7: Independent Static Standing Balance Static Standing - Balance Support: Bilateral upper extremity supported Static Standing - Level of Assistance: 5: Stand by assistance Static Standing - Comment/# of Minutes: 1 min with no LOB with UE support.   End of Session PT - End of Session Equipment Utilized During Treatment: Gait belt;Cervical collar Activity Tolerance: Patient limited by pain;Patient limited by fatigue Patient left: in bed;with call bell/phone within reach Nurse Communication: Mobility status  GP Functional Assessment Tool Used: clinical judgement Functional Limitation: Mobility: Walking and moving around;Changing and maintaining body position Mobility: Walking and Moving Around Current Status [phone]): At least 40 percent but less than 60 percent impaired, limited or restricted Mobility: Walking and Moving Around Goal Status [phone]): At least 1 percent but less than 20 percent impaired, limited or restricted Changing and Maintaining Body Position Current Status (Q6578): At least 40 percent but less than 60 percent impaired, limited or restricted Changing and Maintaining Body Position Goal Status (I6962): At least 1 percent but less than 20 percent impaired, limited or restricted   Samary Shatz 12/16/2012, 12:16 PM  Kariss Longmire L. Ramiel Forti DPT [phone]

## 2012-12-16 NOTE — Progress Notes (Signed)
CARE MANAGEMENT NOTE 12/16/2012  Patient:  Martin Costa, Martin Costa   Account Number:  [id number]  Date Initiated:  12/16/2012  Documentation initiated by:  Vance Peper  Subjective/Objective Assessment:   52 yr old male s/p C4-T1 fusion     Action/Plan:   No home health needs identified.   Anticipated DC Date:  12/17/2012   Anticipated DC Plan:  HOME/SELF CARE      DC Planning Services  CM consult      PAC Choice  NA   Choice offered to / List presented to:             Status of service:  Completed, signed off Medicare Important Message given?   (If response is "NO", the following Medicare IM given date fields will be blank) Date Medicare IM given:   Date Additional Medicare IM given:    Discharge Disposition:  HOME/SELF CARE  Per UR Regulation:    If discussed at Long Length of Stay Meetings, dates discussed:    Comments:

## 2012-12-16 NOTE — Progress Notes (Signed)
     Subjective: Procedure(s) (LRB): POSTERIOR CERVICAL FUSION/FORAMINOTOMY LEVEL 3 (N/A) 1 Day Post-Op  Patient reports pain as 5 on 0-10 scale.  Reports decreased arm pain reports incisional neck pain   Positive void Negative bowel movement Positive flatus Negative chest pain or shortness of breath  Objective: Vital signs in last 24 hours: Temp:  [97.6 F (36.4 C)-98.6 F (37 C)] 98.6 F (37 C) (01/03 0328) Pulse Rate:  [58-102] 102  (01/03 0328) Resp:  [12-20] 16  (01/03 0328) BP: (142-163)/(83-100) 156/88 mmHg (01/03 0328) SpO2:  [100 %] 100 % (01/03 0328)  Intake/Output from previous day: 01/02 0701 - 01/03 0700 In: 3580 [P.O.:1560; I.V.:2020] Out: 3350 [Urine:3150; Blood:200]  Labs:  [facility] 12/15/12 1255  WBC 7.6  RBC 4.55  HCT 40.6  PLT 326    Basename 12/15/12 1255  NA 139  K 4.5  CL 103  CO2 24  BUN 21  CREATININE 1.15  GLUCOSE 81  CALCIUM 9.4    Basename 12/15/12 1255  LABPT --  INR 1.16    Physical Exam: Neurologically intact ABD soft Neurovascular intact Incision: dressing C/D/I No cellulitis present Compartment soft no SOB/CP  Assessment/Plan: Patient stable  xrays hardware satisfactory.  fx reduction satisfactory Mobilization with physical therapy Encourage incentive spirometry Continue care  Advance diet Up with therapy Plan for discharge tomorrow Discharge home with home health Will adjust pain medications today  Venita Lick, MD [facility] [phone]

## 2012-12-16 NOTE — Progress Notes (Signed)
Referral received for SNF. Chart reviewed and CSW has spoken with RNCM who indicates that patient is for DC to home with Home Health and DME.  CSW to sign off. Please re-consult if CSW needs arise.  Deandrea Vanpelt T. [place] Maiorino, LCSWA  [phone]  

## 2012-12-16 NOTE — Progress Notes (Signed)
Utilization review completed. Khristie Sak, RN, BSN. 

## 2012-12-16 NOTE — Progress Notes (Signed)
Physical Therapy Treatment Patient Details Name: Martin Costa MRN: [id number] DOB: [birth year] Today's Date: 12/16/2012 Time: 6295-2841 PT Time Calculation (min): 25 min  PT Assessment / Plan / Recommendation Comments on Treatment Session  52 y.o. male POD #1 C4-T1 posterior cervical fusion.  He called PT ready to attempt walking this afternoon due to pain better managed.  He is only lightly using RW for balance and support today.  I anticipate that he will not need one for discharge home, but this depends on his progress.      Follow Up Recommendations  Home health PT;Supervision - Intermittent     Does the patient have the potential to tolerate intense rehabilitation    NA  Barriers to Discharge   none      Equipment Recommendations  Rolling walker with 5" wheels;Other (comment) (he may not need RW-TBD depends on progress)    Recommendations for Other Services   none  Frequency Min 5X/week   Plan Discharge plan remains appropriate;Frequency remains appropriate    Precautions / Restrictions Precautions Precautions: Cervical Precaution Comments: reviewed log roll for protection of neck during bed mobility and supine <-> sit transitions.  Required Braces or Orthoses: Cervical Brace Cervical Brace: Hard collar;Applied in supine position   Pertinent Vitals/Pain 6/10 posterior neck pain, radiating into bil shoulders/upper traps.  Pt reports he has had pain meds.  Ice packs applied after session.      Mobility  Bed Mobility Rolling Right: 6: Modified independent (Device/Increase time);With rail Right Sidelying to Sit: 4: Min assist;HOB elevated;With rails Sitting - Scoot to Edge of Bed: 6: Modified independent (Device/Increase time);With rail Sit to Supine: 6: Modified independent (Device/Increase time);HOB flat;With rail Details for Bed Mobility Assistance: pt relied heavily on bed rail and HOB being elevated.  Will need to practice without these assistive devices before going home.     Transfers Sit to Stand: 5: Supervision;With upper extremity assist;From bed Stand to Sit: 5: Supervision;With upper extremity assist;To bed Details for Transfer Assistance: safe hand placement.   Ambulation/Gait Ambulation/Gait Assistance: 5: Supervision Ambulation Distance (Feet): 150 Feet Assistive device: Rolling walker Ambulation/Gait Assistance Details: verbal cues for upright posture and light lean on the hands.  I anticipate that he will not need RW for discharge home, but this is yet TBD.  Gait Pattern: Step-through pattern;Trunk flexed      PT Goals Acute Rehab PT Goals Pt will Roll Supine to Right Side: with modified independence;Other (comment) (no rail) PT Goal: Rolling Supine to Right Side - Progress: Updated due to goal met Pt will go Supine/Side to Sit: with modified independence;with HOB 0 degrees;Other (comment) (no rail) PT Goal: Supine/Side to Sit - Progress: Updated due to goal met Pt will go Sit to Supine/Side: with modified independence;with HOB 0 degrees;Other (comment) (no rail) PT Goal: Sit to Supine/Side - Progress: Updated due to goal met PT Goal: Sit to Stand - Progress: Progressing toward goal PT Goal: Stand to Sit - Progress: Progressing toward goal Pt will Ambulate: >150 feet;with modified independence PT Goal: Ambulate - Progress: Updated due to goal met  Visit Information  Last PT Received On: 12/16/12 Assistance Needed: +1    Subjective Data  Subjective: Pt reports he feels   Cognition  Overall Cognitive Status: Appears within functional limits for tasks assessed/performed    Balance  Static Standing Balance Static Standing - Balance Support: Right upper extremity supported;No upper extremity supported;During functional activity Static Standing - Level of Assistance: 5: Stand by assistance Static Standing -  Comment/# of Minutes: while toileting standing up  End of Session PT - End of Session Equipment Utilized During Treatment: Cervical  collar Activity Tolerance: Patient limited by fatigue;Patient limited by pain Patient left: in bed;with call bell/phone within reach       [place] B. Priyal Musquiz, PT, DPT [phone]   12/16/2012, 3:49 PM

## 2012-12-16 NOTE — Evaluation (Signed)
Occupational Therapy Evaluation Patient Details Name: Martin Costa MRN: [id number] DOB: 16-Jan-1961 Today's Date: 12/16/2012 Time: 4098-1191 OT Time Calculation (min): 28 min  OT Assessment / Plan / Recommendation Clinical Impression  52 yo male s/p C4-T1 posterior fusion that could benefit from skilled OT acutely. Recommend HHOT for d/c planning. pt currently limited by pain    OT Assessment  Patient needs continued OT Services    Follow Up Recommendations  Home health OT    Barriers to Discharge Decreased caregiver support fiance in town for weekend then patient will d/c home alone to a second floor apartment  Equipment Recommendations  None recommended by OT    Recommendations for Other Services    Frequency  Min 2X/week    Precautions / Restrictions Precautions Precautions: Cervical Required Braces or Orthoses: Cervical Brace Cervical Brace: Hard collar;Applied in supine position   Pertinent Vitals/Pain Severe 10 out 10 pain Pt reports pain in bil shoulders worse than neck pain Pt repositioned in chair with pillows to elevate UE to decrease pain RN Cary arriving with pain medication Pt reports that medication is not helping and that the PCA did not help but was doing a better job. Pt is anxious about not being able to self administer medication and waiting on RN to bring medication.    ADL  Eating/Feeding: Minimal assistance Where Assessed - Eating/Feeding: Chair Grooming: Wash/dry face;Teeth care;Minimal assistance Where Assessed - Grooming: Supported standing Upper Body Dressing: Moderate assistance Where Assessed - Upper Body Dressing: Unsupported sitting Lower Body Dressing: +1 Total assistance Where Assessed - Lower Body Dressing: Supine, head of bed up Toilet Transfer: Minimal assistance Toilet Transfer Method: Sit to stand Toilet Transfer Equipment: Regular height toilet Equipment Used: Gait belt;Rolling walker Transfers/Ambulation Related to ADLs: pt  ambulating to sink ~6 ft with Min (A). Pt fall risk at this time  ADL Comments: pt agreeable to OOB for oral care. pt educated brace and pad change. Pt with previous experience wearing brace PTA. Pt familiar with don Shirlean Schlein. Pt required support of Rt UE with oral care due to pain. Pt completed oral care with Mod (A) Rt Ue and setup tooth brush/paste. Pt returned to sitting s/p oral care. pt with 10 out 10 pain in bil shoulders    OT Diagnosis: Acute pain;Generalized weakness  OT Problem List: Decreased strength;Decreased activity tolerance;Decreased safety awareness;Decreased knowledge of use of DME or AE;Decreased knowledge of precautions;Pain OT Treatment Interventions: Self-care/ADL training;Neuromuscular education;DME and/or AE instruction;Therapeutic activities;Patient/family education;Balance training   OT Goals Acute Rehab OT Goals OT Goal Formulation: With patient Time For Goal Achievement: 12/30/12 Potential to Achieve Goals: Good ADL Goals Pt Will Perform Grooming: with set-up;Standing at sink ADL Goal: Grooming - Progress: Goal set today Pt Will Perform Upper Body Bathing: with set-up;Standing at sink ADL Goal: Upper Body Bathing - Progress: Goal set today Pt Will Perform Lower Body Bathing: with set-up;Sit to stand from chair ADL Goal: Lower Body Bathing - Progress: Goal set today Pt Will Perform Upper Body Dressing: with set-up;Sit to stand from chair ADL Goal: Upper Body Dressing - Progress: Goal set today Pt Will Perform Lower Body Dressing: with set-up;Sit to stand from chair;with adaptive equipment (AE PRN) ADL Goal: Lower Body Dressing - Progress: Goal set today Pt Will Transfer to Toilet: with set-up;Ambulation;Regular height toilet ADL Goal: Toilet Transfer - Progress: Goal set today  Visit Information  Last OT Received On: 12/16/12 Assistance Needed: +1    Subjective Data  Subjective: "its not my neck its my  shoulders" "whatever they are giving me isn't working.  those muscle relaxers are doing nothing" Patient Stated Goal: to get pain under control   Prior Functioning     Home Living Lives With: Alone Available Help at Discharge: Family Type of Home: Apartment Home Access: Stairs to enter Secretary/administrator of Steps: 15 Entrance Stairs-Rails: Can reach both Home Layout: One level Bathroom Shower/Tub: Forensic scientist: Standard Bathroom Accessibility: Yes How Accessible: Accessible via walker Home Adaptive Equipment: None Prior Function Level of Independence: Independent Able to Take Stairs?: Yes Driving: Yes Vocation: Full time employment Comments: Proofreader -  Communication Communication: No difficulties Dominant Hand: Right         Vision/Perception     Cognition  Overall Cognitive Status: Appears within functional limits for tasks assessed/performed Arousal/Alertness: Awake/alert Orientation Level: Appears intact for tasks assessed Behavior During Session: [facility] for tasks performed    Extremity/Trunk Assessment Right Upper Extremity Assessment RUE ROM/Strength/Tone: Deficits;Due to pain RUE ROM/Strength/Tone Deficits: ARom ~30 degrees shoulder flexion, 10 degrees abduction shoulder RUE Sensation: WFL - Light Touch RUE Coordination: WFL - gross motor Left Upper Extremity Assessment LUE ROM/Strength/Tone: Deficits;Due to pain LUE ROM/Strength/Tone Deficits: ARom ~30 degrees shoulder flexion, 10 degrees abduction shoulder LUE Sensation: WFL - Light Touch LUE Coordination: WFL - gross motor Trunk Assessment Trunk Assessment: Normal     Mobility Bed Mobility Bed Mobility: Supine to Sit;Sitting - Scoot to Edge of Bed Supine to Sit: 3: Mod assist;HOB elevated Sitting - Scoot to Edge of Bed: 4: Min assist Details for Bed Mobility Assistance: Pt attempting to long sit initially with severe pain. pt educated on log roll onto Rt side to increase independence with bed mobility. Pt able to push up  on Rt elbow with severe pain Transfers Transfers: Stand to Sit;Sit to Stand Sit to Stand: 4: Min assist;With upper extremity assist;From bed Stand to Sit: 4: Min assist;With upper extremity assist;To chair/3-in-1 Details for Transfer Assistance: educated on hand placement                    End of Session OT - End of Session Activity Tolerance: Patient limited by pain Patient left: in chair;with call bell/phone within reach Nurse Communication: Mobility status;Precautions  GO Functional Assessment Tool Used: clinical judgement Functional Limitation: Self care Self Care Current Status (Z6109): At least 40 percent but less than 60 percent impaired, limited or restricted Self Care Goal Status (U0454): At least 40 percent but less than 60 percent impaired, limited or restricted   Lucile Shutters 12/16/2012, 11:09 AM Pager: [phone]

## 2012-12-16 NOTE — Op Note (Signed)
Martin Costa, Martin Costa NO.:  [id number]  MEDICAL RECORD NO.:  [id number]  LOCATION:  [id number]                        FACILITY:  [facility]  PHYSICIAN:  Alvy Beal, MD    DATE OF BIRTH:  [birth year]  DATE OF PROCEDURE:  12/15/2012 DATE OF DISCHARGE:                              OPERATIVE REPORT   PREOPERATIVE DIAGNOSES: 1. C5 fracture. 2. Diffuse idiopathic skeletal hyperostosis (DISH).  POSTOPERATIVE DIAGNOSES: 1. C5 fracture. 2. Diffuse idiopathic skeletal hyperostosis (DISH).  OPERATIVE PROCEDURE:  Posterior cervical spine fusion C4 to thoracic 1.  INSTRUMENTATION SYSTEM USED:  DePuy Mountaineer lateral mass screw fixation system with 14 mm lateral mass screws placed at C4, C5, C6, C7, and 25 mm pedicle screws placed at T1.  COMPLICATIONS:  None.  CONDITION:  Stable.  HISTORY:  This is a very pleasant gentleman, who I saw back in September and was diagnosed with DISH of the cervical thoracic spine.  I informed the patient that he was at risk with any type of hyperextension or hyperflexion motion of the head that he could suffer a cervical fracture.  Last week December 24th, he was playing basketball with friends.  He was struck in the forehead by an elbow, hyperextended his neck and had immediate neck pain.  He went to the emergency room.  He was diagnosed with fracture through the anterior osteophytes and into the disk space at C5-6.  The patient elected to return here to [facility] to have me perform definitive treatment.  Preoperatively, the patient was grossly neurologically intact.  He did have some left C6 numbness and dysesthesias but for the most part, he was neurologically intact with motor strength testing.  After discussing treatment options, we elected to proceed with posterior spinal fusion. All appropriate risks, benefits, and alternatives were discussed with the patient and consent was obtained.  OPERATIVE NOTE:  The patient was brought  to the operating room, placed supine on the operating table.  After successful induction of general anesthesia and endotracheal intubation, TEDs, SCDs, and a Foley were inserted.  All appropriate evoked motor sensory, evoked motor potential monitors, SSEP, and free running EMG monitors were placed by the NeuroStim representative.  Once this was done, a Engineer, manufacturing systems were placed.  They were placed appropriately into the lateral side of the hip and torqued down appropriately.  The patient was then secured to the [facility] table with the Mayfield tong locked in place.  The spine frame was then placed over the patient and secured into position.  He was then rotated into the prone position.  The arms were taped at the side and padded and all bony prominences were well padded.  The head and neck were properly secured in the Mayfield tongs.  The back of the neck was then prepped and draped in a standard fashion. Appropriate time-out was done to confirm patient, procedure, and all other pertinent important data.  A midline incision was started at the level of the C3 spinous process and carried down to the level of T2 spinous process.  Sharp dissection was carried out in the midline down to the deep fascia.  The deep fascia was  sharply incised and using bipolar electrocautery obtained hemostasis.  Then using electrocautery and a Cobb elevator, I stripped the paraspinal muscles to expose the posterior aspect of the spine.  I dissected down to expose the lamina and the lateral mass and facet complex.  I exposed C4, C5, C6, C7, and T1 and a portion of that at T2. Once this was done bilaterally, I used bipolar electrocautery to obtain hemostasis.  I then removed the spinous process tips for bone graft later on.  At C4 identified midportion of the lateral mass and placed my awl in the inferior medial quadrant.  I used a high-speed bur to break through the cortical bone and then a  drill.  The drill was aimed cephalad and lateral and x-ray guidance was used to confirm satisfactory trajectory of the drill.  I then tapped the hole, probed the hole again and confirmed I had solid bony bridge.  I then placed a 14 mm DePuy Mountaineer lateral mass screw at that level.  I then repeated this at C5, C6, and C7 bilaterally once I had the lateral mass screws properly positioned.  I then elected to place a T1 thoracic pedicle screw.  I felt as if I had good purchase with these screws.  I could stop at T1. If not, I would go to T2.  I then used x-ray to identify the lateral wall of the pedicle, and I used a high-speed bur to decorticate the bone.  I then used my awl to advance into the pedicle.  Once I was approximately 15 mm in depth, I then slightly adjusted my trajectory so I would go more medial and I went into the vertebral body.  I confirmed satisfactory position and alignment in both planes.  I then tapped, probed with a ball-tip Feeler, confirming I had a solid bony canal.  I then placed a 25 mm length pedicle screw 4.35 diameter into the T1 pedicle.  Both sides I had excellent purchase.  At this point, I then contoured 2 rods and placed them into the wound and secured them in place.  I did slightly compress the fracture site.  All locking nuts were then properly torqued.  I had excellent fixation.  At this point, a transcortical motor evoked potentials were done.  They showed no change from preincision.  At this point, I used a high-speed bur to decorticate the remaining portion of the lamina and the facet complex at C4-5, C5-6, C6-7, C7-T1 and a portion of that at T2.  Once this was done, I placed OP1 at the C5-6 level and then I placed the remaining bone graft that I had taken from autograft bone from harvest and then mixed this with cortical cancellous chips.  Prior to placing the bone graft, it should be noted that I copiously irrigated with almost a liter of fluid.   With the bone graft in place, I then placed cross-link at the C5 level for added stability.  Once this was completed, I then placed antibiotic impregnated methylmethacrylate beads to decrease the chance of postoperative wound infection.  I then closed the deep fascia with interrupted #1 Vicryl sutures, then a layer of 2-0 Vicryl sutures, and then I used #2 Prolene running vertical mattress suture to close the skin.  Dry dressings were applied.  The flat Jean Rosenthal table was then repositioned over the patient and he was then rotated back into the supine position.  The Mayfield tongs were removed and an Aspen collar was reapplied.  The patient had a laceration over the forehead that was sutured approximately a week and a half ago and so these were removed and Steri-Strips were applied.  The patient was then awoken, extubated, and transferred to the PACU without incident.  At the end of the case, all needle and sponge counts were correct.     Alvy Beal, MD    DDB/MEDQ  D:  12/15/2012  T:  12/16/2012  Job:  [id number]

## 2012-12-17 NOTE — Progress Notes (Signed)
Occupational Therapy Treatment Patient Details Name: Martin Costa MRN: [id number] DOB: March 16, 1961 Today's Date: 12/17/2012 Time: 4401-0272 OT Time Calculation (min): 59 min  OT Assessment / Plan / Recommendation Comments on Treatment Session Pt still with significant shoulder pain, but neck pain overall reduced.  Able to exhibit approximately 80 degrees of bilateral shoulder flexion for grooming tasks.  Overall modified independent with mobility in the room and to the sink.  Has been educated on AE and techniques to follow precautions.  Will have initial assistance from his girlfriend at discharge but will mostly be by himslef during the day.  Recommend HHOT eval for safety.    Follow Up Recommendations  Home health OT       Equipment Recommendations  None recommended by OT       Frequency Min 2X/week   Plan Discharge plan remains appropriate    Precautions / Restrictions Precautions Precautions: Cervical Required Braces or Orthoses: Cervical Brace Cervical Brace: Hard collar;Applied in sitting position Restrictions Weight Bearing Restrictions: No   Pertinent Vitals/Pain Pain 7/10 in his shoulders, has ice packs and pain meds given earlier    ADL  Grooming: Performed;Wash/dry hands;Wash/dry face;Teeth care;Supervision/safety Where Assessed - Grooming: Unsupported standing Lower Body Dressing: Performed;Minimal assistance Where Assessed - Lower Body Dressing: Unsupported sit to stand Toilet Transfer: Performed;Modified independent Toilet Transfer Method: Other (comment) (ambulate without assistive device to regular toilet) Toilet Transfer Equipment: Regular height toilet Toileting - Clothing Manipulation and Hygiene: Simulated;Independent Where Assessed - Toileting Clothing Manipulation and Hygiene: Sit to stand from 3-in-1 or toilet Tub/Shower Transfer: Performed;Modified independent Tub/Shower Transfer Method: Ambulating Equipment Used: Other (comment) (cervical collar at all  times) Transfers/Ambulation Related to ADLs: Pt overall modified indepedent with mobility, just moving at a slightly slower rate than normal, but no assistive device needed. ADL Comments: Pt able to perform shoulder flexion to appproximately 80 degrees for grooming tasks this morning without physical assist.  Educated on AE available to assist with LB selfcare including sockaide, reacher, LH sponge, and LH shoe horn.  Recommended use of reacher secondary to pt having difficulty seeing his feet for donning pants.  Has second collar for showeirng as well.  Also discussed type of UB clothing that should ber worn to make it easier to perform his dressing.      OT Goals ADL Goals ADL Goal: Grooming - Progress: Met ADL Goal: Lower Body Dressing - Progress: Progressing toward goals ADL Goal: Toilet Transfer - Progress: Met  Visit Information  Last OT Received On: 12/17/12 Assistance Needed: +1    Subjective Data  Subjective: If my shoulders weren't hurting so bad I could do more. Patient Stated Goal: To not be in as much pain.      Cognition  Overall Cognitive Status: Appears within functional limits for tasks assessed/performed Arousal/Alertness: Awake/alert Orientation Level: Appears intact for tasks assessed Behavior During Session: [name] for tasks performed    Mobility  Shoulder Instructions Bed Mobility Bed Mobility: Rolling Right Rolling Right: 6: Modified independent (Device/Increase time);With rail Right Sidelying to Sit: 6: Modified independent (Device/Increase time);With rails Transfers Transfers: Sit to Stand Sit to Stand: 6: Modified independent (Device/Increase time);Without upper extremity assist;From bed;From toilet Stand to Sit: 6: Modified independent (Device/Increase time);Without upper extremity assist;To chair/3-in-1          Balance Balance Balance Assessed: Yes Static Standing Balance Static Standing - Balance Support: No upper extremity supported Static  Standing - Level of Assistance: 6: Modified independent (Device/Increase time) Dynamic Standing Balance Dynamic Standing -  Balance Support: No upper extremity supported Dynamic Standing - Level of Assistance: 6: Modified independent (Device/Increase time)   End of Session OT - End of Session Equipment Utilized During Treatment: Cervical collar Activity Tolerance: Patient tolerated treatment well Patient left: in chair;with family/visitor present Nurse Communication: Mobility status     Martin Costa OTR/L Pager number [phone] 12/17/2012, 10:33 AM

## 2012-12-17 NOTE — Progress Notes (Signed)
Physical Therapy Treatment Patient Details Name: Martin Costa MRN: [id number] DOB: Jun 07, 1961 Today's Date: 12/17/2012 Time: 8657-8469 PT Time Calculation (min): 25 min  PT Assessment / Plan / Recommendation Comments on Treatment Session  Pt making great progress with mobility and at functional level safe for discharge home. Completed stair education today.    Follow Up Recommendations  Home health PT;Supervision - Intermittent           Equipment Recommendations  None recommended by PT       Frequency Min 5X/week   Plan Discharge plan remains appropriate;Frequency remains appropriate    Precautions / Restrictions Precautions Precautions: Cervical Required Braces or Orthoses: Cervical Brace Cervical Brace: Hard collar;Other (comment) (pt with collar on upon arrival to room) Restrictions Weight Bearing Restrictions: No    Pertinent Vitals/Pain No complaints of pain with session today, pt premedicated before therapy session.    Mobility  Bed Mobility Bed Mobility: Rolling Right Rolling Right: 6: Modified independent (Device/Increase time);With rail Right Sidelying to Sit: 6: Modified independent (Device/Increase time);With rails Transfers Sit to Stand: 6: Modified independent (Device/Increase time);From chair/3-in-1;With upper extremity assist;With armrests Stand to Sit: 6: Modified independent (Device/Increase time);To chair/3-in-1;With upper extremity assist;With armrests Details for Transfer Assistance: no cues or assistance needed, increased time needed. Ambulation/Gait Ambulation/Gait Assistance: 6: Modified independent (Device/Increase time) Ambulation Distance (Feet): 400 Feet Assistive device: None Ambulation/Gait Assistance Details: no cues or assistance needed with gait. Gait Pattern: Step-through pattern;Decreased stride length Stairs: Yes Stairs Assistance: 5: Supervision Stairs Assistance Details (indicate cue type and reason): cues for posture. Pt ascended  stairs with alternating pattern and descended stairs with step to pattern Stair Management Technique: One rail Left;Alternating pattern;Step to pattern Number of Stairs: 10       PT Goals Acute Rehab PT Goals PT Goal: Sit to Stand - Progress: Met PT Goal: Stand to Sit - Progress: Met PT Goal: Ambulate - Progress: Met PT Goal: Up/Down Stairs - Progress: Met Additional Goals PT Goal: Additional Goal #1 - Progress: Met  Visit Information  Last PT Received On: 12/17/12 Assistance Needed: +1    Subjective Data  Subjective: No new compliants, agreeable to therapy today.   Cognition  Overall Cognitive Status: Appears within functional limits for tasks assessed/performed Arousal/Alertness: Awake/alert Orientation Level: Appears intact for tasks assessed Behavior During Session: [facility] for tasks performed    Balance  Balance Balance Assessed: Yes Static Standing Balance Static Standing - Balance Support: No upper extremity supported Static Standing - Level of Assistance: 6: Modified independent (Device/Increase time) Dynamic Standing Balance Dynamic Standing - Balance Support: No upper extremity supported Dynamic Standing - Level of Assistance: 6: Modified independent (Device/Increase time)  End of Session PT - End of Session Equipment Utilized During Treatment: Gait belt;Cervical collar Activity Tolerance: Patient tolerated treatment well Patient left: in chair;with call bell/phone within reach Nurse Communication: Mobility status;Patient requests pain meds   GP     Sallyanne Kuster 12/17/2012, 12:14 PM  Sallyanne Kuster, PTA Office- [phone]

## 2012-12-17 NOTE — Progress Notes (Signed)
Patient ID: Martin Costa, male   DOB: September 13, 1961, 52 y.o.   MRN: [id number] Subjective: 2 Days Post-Op Procedure(s) (LRB): POSTERIOR CERVICAL FUSION/FORAMINOTOMY LEVEL 3 (N/A)    Patient reports pain as moderate but mainly in his shoulders, not as much in neck or back at this point particularly after change of meds to valium  Objective:   VITALS:   Filed Vitals:   12/17/12 0559  BP: 144/90  Pulse: 87  Temp: 98.6 F (37 C)  Resp: 20    Neurovascular intact Incision: dressing C/D/I, dressing changed  LABS  Basename 12/15/12 1255  HGB 14.0  HCT 40.6  WBC 7.6  PLT 326     Basename 12/15/12 1255  NA 139  K 4.5  BUN 21  CREATININE 1.15  GLUCOSE 81     Basename 12/15/12 1255  LABPT --  INR 1.16     Assessment/Plan: 2 Days Post-Op Procedure(s) (LRB): POSTERIOR CERVICAL FUSION/FORAMINOTOMY LEVEL 3 (N/A)   Advance diet Up with therapy Plan for discharge tomorrow  Reviewed questions and concerns that he had Dr. Shon Baton on call tomorrow to make ultimate plan for D/C Will most like;y need [facility] nursing for dressing changes and home PT for initial activity clearance

## 2012-12-18 DIAGNOSIS — S129XXA Fracture of neck, unspecified, initial encounter: Secondary | ICD-10-CM | POA: Diagnosis present

## 2012-12-18 MED ORDER — OXYCODONE HCL ER 15 MG PO T12A: 15.0000 mg | EXTENDED_RELEASE_TABLET | Freq: Two times a day (BID) | ORAL | Status: DC

## 2012-12-18 MED ORDER — PHENOL 1.4 % MT LIQD: 1.0000 | OROMUCOSAL | Status: DC | PRN

## 2012-12-18 MED ORDER — DIAZEPAM 2 MG PO TABS: 2.0000 mg | ORAL_TABLET | Freq: Three times a day (TID) | ORAL | Status: DC | PRN

## 2012-12-18 MED ORDER — OXYCODONE HCL 10 MG PO TABS: 10.0000 mg | ORAL_TABLET | ORAL | Status: DC | PRN

## 2012-12-18 MED ORDER — ONDANSETRON HCL 4 MG PO TABS: 4.0000 mg | ORAL_TABLET | Freq: Three times a day (TID) | ORAL | Status: DC | PRN

## 2012-12-18 MED ORDER — DOCUSATE SODIUM 100 MG PO CAPS: 100.0000 mg | ORAL_CAPSULE | Freq: Two times a day (BID) | ORAL | Status: DC

## 2012-12-18 NOTE — Progress Notes (Signed)
Physical Therapy Treatment Patient Details Name: Martin Costa MRN: [id number] DOB: 1961-08-17 Today's Date: 12/18/2012 Time: 7829-5621 PT Time Calculation (min): 13 min  PT Assessment / Plan / Recommendation Comments on Treatment Session  Pt progressing great today with most goals met.    Follow Up Recommendations  Home health PT;Supervision - Intermittent           Equipment Recommendations  None recommended by PT       Frequency Min 5X/week   Plan Discharge plan remains appropriate;Frequency remains appropriate    Precautions / Restrictions Precautions Precautions: Cervical Cervical Brace: Hard collar    Pertinent Vitals/Pain Reports some pain (not rated). RN notified and pain medicine given prior to gait/stairs with PT.    Mobility  Transfers Sit to Stand: 7: Independent;From chair/3-in-1;With upper extremity assist;With armrests Stand to Sit: 7: Independent;To chair/3-in-1;With upper extremity assist;With armrests Details for Transfer Assistance: no cues/assit/or incr time needed. Ambulation/Gait Ambulation/Gait Assistance: 7: Independent Ambulation Distance (Feet): 400 Feet Assistive device: None Gait Pattern: Within Functional Limits Stairs Assistance: 6: Modified independent (Device/Increase time) Stair Management Technique: One rail Left;Forwards;Alternating pattern Number of Stairs: 12       PT Goals Acute Rehab PT Goals PT Goal: Sit to Stand - Progress: Met PT Goal: Stand to Sit - Progress: Met PT Goal: Ambulate - Progress: Met PT Goal: Up/Down Stairs - Progress: Met Additional Goals PT Goal: Additional Goal #1 - Progress: Met  Visit Information  Last PT Received On: 12/18/12 Assistance Needed: +1    Subjective Data  Subjective: No new complaints, ready to go home. Agreeable to PT at this time.   Cognition  Overall Cognitive Status: Appears within functional limits for tasks assessed/performed Arousal/Alertness: Awake/alert Orientation Level:  Appears intact for tasks assessed Behavior During Session: [facility] for tasks performed       End of Session PT - End of Session Equipment Utilized During Treatment: Gait belt;Cervical collar Activity Tolerance: Patient tolerated treatment well Patient left: in chair;with call bell/phone within reach;with family/visitor present Nurse Communication: Mobility status   GP     Sallyanne Kuster 12/18/2012, 10:31 AM  Sallyanne Kuster, PTA Office- [phone]

## 2012-12-18 NOTE — Progress Notes (Signed)
Faxed [facility] orders and facesheet for [facility] RN MWF dressing changes. Contacted pt with [facility] contact info and soc 12/19/2011. Isidoro Donning RN CCM Case Mgmt phone [phone]

## 2012-12-18 NOTE — Progress Notes (Signed)
NCM spoke to pt on cell number and explained [facility] RN not available for qd and that he would need someone teachable in home to do dressing changes. States he can have someone come on T, Th, Sat and Sunday. He will just need [facility] RN on MWF for dressing changes. NCM contacted [facility] and they can do MWF. Will need modification in orders. Isidoro Donning RN CCM Case Mgmt phone [phone]

## 2012-12-18 NOTE — Progress Notes (Signed)
NCM spoke to pt and explained call to [facility] for [facility] approval for soc 1/6. NCM contacted [facility] and they will have to speak with administration to approve qd dressing changes and verify coverage with his [employer]. Pt dc home. Pt requested NCM contact him on cell phone. Isidoro Donning RN CCM Case Mgmt phone [phone]

## 2012-12-18 NOTE — Discharge Summary (Signed)
Patient ID: Helmut Hennon MRN: [id number] DOB/AGE: 08-05-1961 52 y.o.  Admit date: 12/15/2012 Discharge date: 12/18/2012  Admission Diagnoses:  Principal Problem:  *Cervical spine fracture   Discharge Diagnoses:  Principal Problem:  *Cervical spine fracture  status post Procedure(s): POSTERIOR CERVICAL FUSION/FORAMINOTOMY LEVEL 3  Past Medical History  Diagnosis Date  . Arthritis   . Bronchitis     hx of  . GERD (gastroesophageal reflux disease)     hx of    Surgeries: Procedure(s): POSTERIOR CERVICAL FUSION/FORAMINOTOMY LEVEL 3 on 12/15/2012   Consultants:  none  Discharged Condition: Improved  Hospital Course: Govind Furey is an 52 y.o. male who was admitted 12/15/2012 for operative treatment of Cervical spine fracture. Patient failed conservative treatments (please see the history and physical for the specifics) and had severe unremitting pain that affects sleep, daily activities and work/hobbies. After pre-op clearance, the patient was taken to the operating room on 12/15/2012 and underwent  Procedure(s): POSTERIOR CERVICAL FUSION/FORAMINOTOMY LEVEL 3.    Patient was given perioperative antibiotics: Anti-infectives     Start     Dose/Rate Route Frequency Ordered Stop   12/16/12 0348   ceFAZolin (ANCEF) IVPB 1 g/50 mL premix        1 g 100 mL/hr over 30 Minutes Intravenous Every 8 hours 12/16/12 0348 12/16/12 1306   12/15/12 1717   vancomycin (VANCOCIN) powder  Status:  Discontinued          As needed 12/15/12 1718 12/15/12 2018   12/14/12 2004   ceFAZolin (ANCEF) IVPB 2 g/50 mL premix        2 g 100 mL/hr over 30 Minutes Intravenous 30 min pre-op 12/14/12 2004 12/15/12 1620           Patient was given sequential compression devices and early ambulation to prevent DVT.   Patient benefited maximally from hospital stay and there were no complications. At the time of discharge, the patient was urinating/moving their bowels without difficulty, tolerating a regular  diet, pain is controlled with oral pain medications and they have been cleared by PT/OT.   Recent vital signs: Patient Vitals for the past 24 hrs:  BP Temp Pulse Resp SpO2  12/18/12 0712 155/64 mmHg 98.7 F (37.1 C) 80  20  98 %  12/17/12 2217 150/62 mmHg 98.9 F (37.2 C) 85  20  99 %  12/17/12 1400 157/68 mmHg 98.9 F (37.2 C) 98  20  100 %     Recent laboratory studies:  Basename 12/15/12 1255  WBC 7.6  HGB 14.0  HCT 40.6  PLT 326  NA 139  K 4.5  CL 103  CO2 24  BUN 21  CREATININE 1.15  GLUCOSE 81  INR 1.16  CALCIUM 9.4     Discharge Medications:     Medication List     As of 12/18/2012  8:19 AM    ASK your doctor about these medications         HYDROcodone-acetaminophen 7.5-500 MG per tablet   Commonly known as: LORTAB   Take 1 tablet by mouth 3 (three) times daily as needed. For pain      ibuprofen 200 MG tablet   Commonly known as: ADVIL,MOTRIN   Take 600 mg by mouth daily as needed. For pain      methocarbamol 750 MG tablet   Commonly known as: ROBAXIN   Take 750 mg by mouth 4 (four) times daily.      SYSTANE 0.4-0.3 % Soln  Generic drug: Polyethyl Glycol-Propyl Glycol   Place 1 drop into both eyes daily as needed. For dry eyes        Diagnostic Studies: Dg Cervical Spine 2 Or 3 Views  12/16/2012  *RADIOLOGY REPORT*  Clinical Data: Postop C5 fracture.  CERVICAL SPINE - 2-3 VIEW  Comparison: Intraoperative imaging 12/15/2012  Findings: Changes of posterior fusion noted from C4-T1.  Anatomic alignment.  No complicating feature.  IMPRESSION: Posterior fusion C4-T1.   Original Report Authenticated By: Charlett Nose, M.D.    Dg Cervical Spine 2-3 Views  12/15/2012  *RADIOLOGY REPORT*  Clinical Data: Cervical spine fracture.  CERVICAL SPINE - 2-3 VIEW  Comparison: None.  Findings: AP and lateral C-arm images demonstrate the patient has undergone posterior fusion from C4-T1.  Pedicle screws and posterior rods are in place.  Alignment appears anatomic in the  lateral view.  IMPRESSION: Posterior fusion performed from C4-T1.   Original Report Authenticated By: Francene Boyers, M.D.    Dg C-arm Gt 120 Min  12/15/2012  C-arm fluoroscopy was provided for cervical fusion.   Original Report Authenticated By: Francene Boyers, M.D.         Discharge Plan:  discharge to home with [facility]  Disposition:  Home Doing well F/u 2 weeks   Signed: Venita Lick D for Dr. Venita Lick [facility] [phone] 12/18/2012, 8:19 AM

## 2012-12-19 ENCOUNTER — Encounter (HOSPITAL_COMMUNITY): Payer: Self-pay | Admitting: Orthopedic Surgery

## 2012-12-26 ENCOUNTER — Telehealth: Payer: Self-pay | Admitting: Family Medicine

## 2012-12-26 NOTE — Telephone Encounter (Signed)
Noted, FYI

## 2012-12-26 NOTE — Telephone Encounter (Signed)
[facility] will not be seeing pt at all this week. Pt will be out of town in [place], [place] the entire week.

## 2012-12-28 DIAGNOSIS — Z4801 Encounter for change or removal of surgical wound dressing: Secondary | ICD-10-CM

## 2012-12-28 DIAGNOSIS — Z4789 Encounter for other orthopedic aftercare: Secondary | ICD-10-CM

## 2013-02-20 ENCOUNTER — Other Ambulatory Visit (HOSPITAL_COMMUNITY): Payer: Self-pay | Admitting: Orthopedic Surgery

## 2013-02-20 ENCOUNTER — Inpatient Hospital Stay
Admission: RE | Admit: 2013-02-20 | Discharge: 2013-02-20 | Disposition: A | Discharge status: Home / Self Care | Payer: Self-pay | Source: Ambulatory Visit | Attending: Orthopedic Surgery | Admitting: Orthopedic Surgery

## 2013-02-20 DIAGNOSIS — R52 Pain, unspecified: Secondary | ICD-10-CM

## 2014-01-04 ENCOUNTER — Other Ambulatory Visit: Payer: Self-pay | Admitting: Family Medicine

## 2014-01-04 ENCOUNTER — Encounter: Payer: Self-pay | Admitting: Family Medicine

## 2014-01-04 ENCOUNTER — Ambulatory Visit (INDEPENDENT_AMBULATORY_CARE_PROVIDER_SITE_OTHER): Payer: BC Managed Care – PPO | Admitting: Family Medicine

## 2014-01-04 VITALS — BP 130/84 | HR 60 | Temp 98.6°F | Wt 261.0 lb

## 2014-01-04 DIAGNOSIS — Z0000 Encounter for general adult medical examination without abnormal findings: Secondary | ICD-10-CM

## 2014-01-04 DIAGNOSIS — E669 Obesity, unspecified: Secondary | ICD-10-CM

## 2014-01-04 DIAGNOSIS — R5383 Other fatigue: Secondary | ICD-10-CM

## 2014-01-04 DIAGNOSIS — R400 Somnolence: Secondary | ICD-10-CM

## 2014-01-04 DIAGNOSIS — G4710 Hypersomnia, unspecified: Secondary | ICD-10-CM

## 2014-01-04 DIAGNOSIS — R5381 Other malaise: Secondary | ICD-10-CM

## 2014-01-04 LAB — CBC WITH DIFFERENTIAL/PLATELET
BASOS ABS: 0 10*3/uL (ref 0.0–0.1)
Basophils Relative: 0.5 % (ref 0.0–3.0)
EOS ABS: 0.1 10*3/uL (ref 0.0–0.7)
Eosinophils Relative: 1.6 % (ref 0.0–5.0)
HEMATOCRIT: 42.1 % (ref 39.0–52.0)
HEMOGLOBIN: 14.4 g/dL (ref 13.0–17.0)
LYMPHS ABS: 3 10*3/uL (ref 0.7–4.0)
Lymphocytes Relative: 37.6 % (ref 12.0–46.0)
MCHC: 34.3 g/dL (ref 30.0–36.0)
MCV: 90.7 fl (ref 78.0–100.0)
Monocytes Absolute: 0.7 10*3/uL (ref 0.1–1.0)
Monocytes Relative: 9 % (ref 3.0–12.0)
NEUTROS ABS: 4.2 10*3/uL (ref 1.4–7.7)
Neutrophils Relative %: 51.3 % (ref 43.0–77.0)
Platelets: 331 10*3/uL (ref 150.0–400.0)
RBC: 4.64 Mil/uL (ref 4.22–5.81)
RDW: 14.9 % — AB (ref 11.5–14.6)
WBC: 8.1 10*3/uL (ref 4.5–10.5)

## 2014-01-04 LAB — POCT URINALYSIS DIPSTICK
BILIRUBIN UA: NEGATIVE
Glucose, UA: NEGATIVE
KETONES UA: NEGATIVE
Leukocytes, UA: NEGATIVE
Nitrite, UA: NEGATIVE
PH UA: 5.5
PROTEIN UA: NEGATIVE
RBC UA: NEGATIVE
Spec Grav, UA: 1.02
Urobilinogen, UA: 0.2

## 2014-01-04 NOTE — Addendum Note (Signed)
Addended by: Elmer Picker on: 01/04/2014 01:04 PM   Modules accepted: Orders

## 2014-01-04 NOTE — Progress Notes (Signed)
   Subjective:    Patient ID: Martin Costa, male    DOB: December 08, 1961, 53 y.o.   MRN: [id number]  HPI Patient seen with chief complaint of progressive fatigue for a least 2 months. His had about 3 weeks of some cough with preceding URI symptoms but main complaint is fatigue. He's had increased sleepiness at night. He has intermittent symptoms of feeling hot and cold but no confirmed fever. His weight and appetite been stable. Generally sleeps about 6-7 hours per night and usually feels rested. He has daytime fatigue but no significant daytime somnolence. No depressive symptoms. Denies any recent chest pains or abdominal pain. No dyspnea.  Currently takes no medications. He had complicated neck fracture sports injury 2013 and subsequent fusion cervical spine. No consistent exercise. No excessive alcohol use.   Past Medical History  Diagnosis Date  . Arthritis   . Bronchitis     hx of  . GERD (gastroesophageal reflux disease)     hx of   Past Surgical History  Procedure Laterality Date  . Foot surgery  2003    tendon repair  . Leg tendon surgery      left  . Eye surgery      left eyelid surgery  . Posterior cervical fusion/foraminotomy  12/15/2012    Procedure: POSTERIOR CERVICAL FUSION/FORAMINOTOMY LEVEL 3;  Surgeon: Melina Schools, MD;  Location: [facility];  Service: Orthopedics;  Laterality: N/A;  POSTERIOR CERVICAL FUSION CERVICAL FOUR TO THORACIC TWO    reports that he has never smoked. He does not have any smokeless tobacco history on file. He reports that he does not use illicit drugs. His alcohol history is not on file. family history includes Arthritis in his father and mother. No Known Allergies    Review of Systems  Constitutional: Positive for fatigue. Negative for fever, chills, appetite change and unexpected weight change.  HENT: Negative for congestion and trouble swallowing.   Respiratory: Positive for cough. Negative for shortness of breath and wheezing.     Gastrointestinal: Negative for nausea, vomiting, abdominal pain and diarrhea.  Endocrine: Negative for polydipsia and polyuria.  Genitourinary: Negative for dysuria.  Neurological: Negative for dizziness and syncope.  Hematological: Negative for adenopathy.  Psychiatric/Behavioral: Negative for confusion, dysphoric mood and agitation.       Objective:   Physical Exam  Constitutional: He is oriented to person, place, and time. He appears well-developed and well-nourished.  HENT:  Mouth/Throat: Oropharynx is clear and moist.  Neck: Neck supple. No thyromegaly present.  Cardiovascular: Normal rate.   No murmur heard. Pulmonary/Chest: Effort normal and breath sounds normal. No respiratory distress. He has no wheezes. He has no rales.  Abdominal: Soft. Bowel sounds are normal. He exhibits no distension and no mass. There is no tenderness. There is no rebound and no guarding.  Musculoskeletal: He exhibits no edema.  Neurological: He is alert and oriented to person, place, and time.          Assessment & Plan:  Patient presents with progressive fatigue for over 2 months. Epworth Sleepiness Scale is 5 which would make obstructive sleep apnea less likely. He has very nonspecific symptoms. Obtain screening labs with CBC, TSH, chemistries. If all labs normal, consider starting more consistent exercise and weight loss.  Obesity. We discussed the importance of weight loss

## 2014-01-04 NOTE — Progress Notes (Signed)
Pre visit review using our clinic review tool, if applicable. No additional management support is needed unless otherwise documented below in the visit note. 

## 2014-01-05 LAB — HEPATIC FUNCTION PANEL
ALBUMIN: 4.1 g/dL (ref 3.5–5.2)
ALK PHOS: 152 U/L — AB (ref 39–117)
ALT: 18 U/L (ref 0–53)
AST: 17 U/L (ref 0–37)
BILIRUBIN TOTAL: 0.6 mg/dL (ref 0.3–1.2)
Bilirubin, Direct: 0.1 mg/dL (ref 0.0–0.3)
Total Protein: 7.4 g/dL (ref 6.0–8.3)

## 2014-01-05 LAB — BASIC METABOLIC PANEL
BUN: 14 mg/dL (ref 6–23)
CO2: 28 meq/L (ref 19–32)
Calcium: 9.2 mg/dL (ref 8.4–10.5)
Chloride: 105 mEq/L (ref 96–112)
Creatinine, Ser: 1.2 mg/dL (ref 0.4–1.5)
GFR: 80.47 mL/min (ref >60.00)
GLUCOSE: 72 mg/dL (ref 70–99)
POTASSIUM: 4.1 meq/L (ref 3.5–5.1)
SODIUM: 140 meq/L (ref 135–145)

## 2014-01-05 LAB — LIPID PANEL
Cholesterol: 180 mg/dL (ref 0–200)
HDL: 46.6 mg/dL (ref >39.00)
LDL Cholesterol: 118 mg/dL — ABNORMAL HIGH (ref 0–99)
TRIGLYCERIDES: 79 mg/dL (ref 0.0–149.0)
Total CHOL/HDL Ratio: 4
VLDL: 15.8 mg/dL (ref 0.0–40.0)

## 2014-01-05 LAB — TSH: TSH: 1.09 u[IU]/mL (ref 0.35–5.50)

## 2014-01-05 LAB — PSA: PSA: 0.87 ng/mL (ref 0.10–4.00)

## 2014-01-15 ENCOUNTER — Encounter: Payer: Self-pay | Admitting: Family Medicine

## 2014-01-15 ENCOUNTER — Ambulatory Visit (INDEPENDENT_AMBULATORY_CARE_PROVIDER_SITE_OTHER): Payer: BC Managed Care – PPO | Admitting: Family Medicine

## 2014-01-15 VITALS — BP 140/80 | HR 87 | Temp 98.5°F | Ht 70.5 in | Wt 261.0 lb

## 2014-01-15 DIAGNOSIS — Z0000 Encounter for general adult medical examination without abnormal findings: Secondary | ICD-10-CM

## 2014-01-15 DIAGNOSIS — R748 Abnormal levels of other serum enzymes: Secondary | ICD-10-CM

## 2014-01-15 NOTE — Patient Instructions (Signed)
We will call you with colonoscopy 

## 2014-01-15 NOTE — Progress Notes (Signed)
Subjective:    Patient ID: Martin Costa, male    DOB: 16-Nov-1961, 53 y.o.   MRN: [id number]  HPI For complete physical. Had some recent fatigue issues but has not been exercising and has gained some weight this year. Takes no medications. Recent labs mostly unremarkable. He had mildly elevated alkaline phosphatase but no other liver transaminase elevations. He has not had previous screening colonoscopy. Nonsmoker. He declines flu vaccine. Last tetanus estimated at less than 10 years but he does not have exact date. He thinks his father had prostate cancer but cannot confirm  Past Medical History  Diagnosis Date  . Arthritis   . Bronchitis     hx of  . GERD (gastroesophageal reflux disease)     hx of   Past Surgical History  Procedure Laterality Date  . Foot surgery  2003    tendon repair  . Leg tendon surgery      left  . Eye surgery      left eyelid surgery  . Posterior cervical fusion/foraminotomy  12/15/2012    Procedure: POSTERIOR CERVICAL FUSION/FORAMINOTOMY LEVEL 3;  Surgeon: Melina Schools, MD;  Location: [facility];  Service: Orthopedics;  Laterality: N/A;  POSTERIOR CERVICAL FUSION CERVICAL FOUR TO THORACIC TWO    reports that he has never smoked. He does not have any smokeless tobacco history on file. He reports that he does not use illicit drugs. His alcohol history is not on file. family history includes Arthritis in his father and mother; Cancer in his father; Hypertension in his mother; Kidney failure in his mother. No Known Allergies    Review of Systems  Constitutional: Positive for fatigue. Negative for fever, activity change, appetite change and unexpected weight change.  HENT: Negative for congestion, ear pain and trouble swallowing.   Eyes: Negative for pain and visual disturbance.  Respiratory: Negative for cough, shortness of breath and wheezing.   Cardiovascular: Negative for chest pain and palpitations.  Gastrointestinal: Negative for nausea, vomiting, abdominal  pain, diarrhea, constipation, blood in stool, abdominal distention and rectal pain.  Endocrine: Negative for polydipsia and polyuria.  Genitourinary: Negative for dysuria, hematuria and testicular pain.  Musculoskeletal: Negative for arthralgias and joint swelling.  Skin: Negative for rash.  Neurological: Negative for dizziness, syncope and headaches.  Hematological: Negative for adenopathy.  Psychiatric/Behavioral: Negative for confusion and dysphoric mood.       Objective:   Physical Exam  Constitutional: He is oriented to person, place, and time. He appears well-developed and well-nourished. No distress.  HENT:  Head: Normocephalic and atraumatic.  Right Ear: External ear normal.  Left Ear: External ear normal.  Mouth/Throat: Oropharynx is clear and moist.  Eyes: Conjunctivae and EOM are normal. Pupils are equal, round, and reactive to light.  Neck: Normal range of motion. Neck supple. No thyromegaly present.  Cardiovascular: Normal rate, regular rhythm and normal heart sounds.   No murmur heard. Pulmonary/Chest: No respiratory distress. He has no wheezes. He has no rales.  Abdominal: Soft. Bowel sounds are normal. He exhibits no distension and no mass. There is no tenderness. There is no rebound and no guarding.  Genitourinary: Rectum normal and prostate normal.  Musculoskeletal: He exhibits no edema.  Lymphadenopathy:    He has no cervical adenopathy.  Neurological: He is alert and oriented to person, place, and time. He displays normal reflexes. No cranial nerve deficit.  Skin: No rash noted.  Psychiatric: He has a normal mood and affect.  Assessment & Plan:  Complete physical. He had some gradual weight gain and lack of activity physically. He declines flu vaccine. We'll confirm last tetanus. Schedule screening colonoscopy. Isolated mildly elevated alkaline phosphatase otherwise asymptomatic. Repeat hepatic panel in 2 months.  Recent Epworth Sleepiness Scale 5-so  low suspicion for obstructive sleep apnea and

## 2014-01-15 NOTE — Progress Notes (Signed)
Pre visit review using our clinic review tool, if applicable. No additional management support is needed unless otherwise documented below in the visit note. 

## 2014-01-19 ENCOUNTER — Encounter: Payer: Self-pay | Admitting: Internal Medicine

## 2014-03-12 ENCOUNTER — Other Ambulatory Visit (INDEPENDENT_AMBULATORY_CARE_PROVIDER_SITE_OTHER): Payer: BC Managed Care – PPO

## 2014-03-12 DIAGNOSIS — R748 Abnormal levels of other serum enzymes: Secondary | ICD-10-CM

## 2014-03-12 LAB — HEPATIC FUNCTION PANEL
ALBUMIN: 4.3 g/dL (ref 3.5–5.2)
ALK PHOS: 147 U/L — AB (ref 39–117)
ALT: 19 U/L (ref 0–53)
AST: 19 U/L (ref 0–37)
Bilirubin, Direct: 0.1 mg/dL (ref 0.0–0.3)
TOTAL PROTEIN: 7.3 g/dL (ref 6.0–8.3)
Total Bilirubin: 0.5 mg/dL (ref 0.3–1.2)

## 2014-03-13 ENCOUNTER — Ambulatory Visit (AMBULATORY_SURGERY_CENTER): Payer: Self-pay | Admitting: *Deleted

## 2014-03-13 VITALS — Ht 71.0 in | Wt 257.6 lb

## 2014-03-13 DIAGNOSIS — Z1211 Encounter for screening for malignant neoplasm of colon: Secondary | ICD-10-CM

## 2014-03-13 MED ORDER — MOVIPREP 100 G PO SOLR: 1.0000 | Freq: Once | ORAL | Status: DC

## 2014-03-13 NOTE — Progress Notes (Signed)
No egg or soy allergy. No anesthesia problems.  

## 2014-03-22 ENCOUNTER — Encounter: Payer: Self-pay | Admitting: Internal Medicine

## 2014-03-27 ENCOUNTER — Ambulatory Visit (AMBULATORY_SURGERY_CENTER): Payer: BC Managed Care – PPO | Admitting: Internal Medicine

## 2014-03-27 ENCOUNTER — Encounter: Payer: Self-pay | Admitting: Internal Medicine

## 2014-03-27 VITALS — BP 119/76 | HR 56 | Temp 98.6°F | Resp 17 | Ht 71.0 in | Wt 257.0 lb

## 2014-03-27 DIAGNOSIS — D129 Benign neoplasm of anus and anal canal: Secondary | ICD-10-CM

## 2014-03-27 DIAGNOSIS — D126 Benign neoplasm of colon, unspecified: Secondary | ICD-10-CM

## 2014-03-27 DIAGNOSIS — D128 Benign neoplasm of rectum: Secondary | ICD-10-CM

## 2014-03-27 DIAGNOSIS — Z1211 Encounter for screening for malignant neoplasm of colon: Secondary | ICD-10-CM

## 2014-03-27 DIAGNOSIS — D3A026 Benign carcinoid tumor of the rectum: Secondary | ICD-10-CM

## 2014-03-27 MED ORDER — SODIUM CHLORIDE 0.9 % IV SOLN: 500.0000 mL | INTRAVENOUS | Status: DC

## 2014-03-27 NOTE — Op Note (Signed)
[facility]  [address] [place], [address]   COLONOSCOPY PROCEDURE REPORT  PATIENT: Martin Costa, Martin Costa  MR#: [id number] BIRTHDATE: 1961/05/20 , 52  yrs. old GENDER: Male ENDOSCOPIST: Jerene Bears, MD REFERRED TT:SVXBL Elease Hashimoto, M.D. PROCEDURE DATE:  03/27/2014 PROCEDURE:   Colonoscopy with snare polypectomy, Colonoscopy with cold biopsy polypectomy, and Colonoscopy with biopsy First Screening Colonoscopy - Avg.  risk and is 50 yrs.  old or older Yes.  Prior Negative Screening - Now for repeat screening. N/A  History of Adenoma - Now for follow-up colonoscopy & has been > or = to 3 yrs.  N/A  Polyps Removed Today? Yes. ASA CLASS:   Class II INDICATIONS:average risk screening and first colonoscopy. MEDICATIONS: MAC sedation, administered by CRNA and propofol (Diprivan) 300mg  IV  DESCRIPTION OF PROCEDURE:   After the risks benefits and alternatives of the procedure were thoroughly explained, informed consent was obtained.  A digital rectal exam revealed no rectal mass.   The LB [id number]  endoscope was introduced through the anus and advanced to the cecum, which was identified by both the appendix and ileocecal valve. No adverse events experienced. The quality of the prep was good, using MoviPrep  The instrument was then slowly withdrawn as the colon was fully examined.   COLON FINDINGS: Three sessile polyps measuring 6, 4 and 2 mm in size were found in the ascending colon (2) and rectosigmoid colon (1). Polypectomy was performed using cold snare and with cold forceps. The resections were complete and the polyp tissue was partially retrieved (6 mm ascending colon polyp most consistent with adenoma was not retrieved).   A subepithelial lesion, measuring 5-7 mm in diameter, was found in the rectum.  Multiple biopsies in tunneled fashion of the lesion were performed using cold forceps.   Moderate diverticulosis was noted in the descending colon and sigmoid  colon. Retroflexed views revealed no abnormalities. The time to cecum=1 minutes 38 seconds.  Withdrawal time=18 minutes 17 seconds.  The scope was withdrawn and the procedure completed. COMPLICATIONS: There were no complications.  ENDOSCOPIC IMPRESSION: 1.   Three sessile polyps measuring 6, 4 and 2 mm in size were found in the ascending colon and rectosigmoid colon; Polypectomy was performed using cold snare and with cold forceps 2.   Subepithelial lesion in the rectum; multiple biopsies of the lesion were performed 3.   Moderate diverticulosis was noted in the descending colon and sigmoid colon  RECOMMENDATIONS: 1.  Await pathology results 2.  High fiber diet 3.  Timing of repeat colonoscopy will be determined by pathology findings. 4.  You will receive a letter within 1-2 weeks with the results of your biopsy as well as final recommendations.  Please call my office if you have not received a letter after 3 weeks.   eSigned:  Jerene Bears, MD 03/27/2014 11:45 AM   cc: The Patient and Carolann Littler, MD   PATIENT NAME:  [name], [name] MR#: [id number]

## 2014-03-27 NOTE — Patient Instructions (Signed)
YOU HAD AN ENDOSCOPIC PROCEDURE TODAY AT THE [facility]: Refer to the procedure report that was given to you for any specific questions about what was found during the examination.  If the procedure report does not answer your questions, please call your gastroenterologist to clarify.  If you requested that your care partner not be given the details of your procedure findings, then the procedure report has been included in a sealed envelope for you to review at your convenience later.  YOU SHOULD EXPECT: Some feelings of bloating in the abdomen. Passage of more gas than usual.  Walking can help get rid of the air that was put into your GI tract during the procedure and reduce the bloating. If you had a lower endoscopy (such as a colonoscopy or flexible sigmoidoscopy) you may notice spotting of blood in your stool or on the toilet paper. If you underwent a bowel prep for your procedure, then you may not have a normal bowel movement for a few days.  DIET: Your first meal following the procedure should be a light meal and then it is ok to progress to your normal diet.  A half-sandwich or bowl of soup is an example of a good first meal.  Heavy or fried foods are harder to digest and may make you feel nauseous or bloated.  Likewise meals heavy in dairy and vegetables can cause extra gas to form and this can also increase the bloating.  Drink plenty of fluids but you should avoid alcoholic beverages for 24 hours.  ACTIVITY: Your care partner should take you home directly after the procedure.  You should plan to take it easy, moving slowly for the rest of the day.  You can resume normal activity the day after the procedure however you should NOT DRIVE or use heavy machinery for 24 hours (because of the sedation medicines used during the test).    SYMPTOMS TO REPORT IMMEDIATELY: A gastroenterologist can be reached at any hour.  During normal business hours, 8:30 AM to 5:00 PM Monday through Friday,  call [phone].  After hours and on weekends, please call the GI answering service at [phone] who will take a message and have the physician on call contact you.   Following lower endoscopy (colonoscopy or flexible sigmoidoscopy):  Excessive amounts of blood in the stool  Significant tenderness or worsening of abdominal pains  Swelling of the abdomen that is new, acute  Fever of 100F or higher    FOLLOW UP: If any biopsies were taken you will be contacted by phone or by letter within the next 1-3 weeks.  Call your gastroenterologist if you have not heard about the biopsies in 3 weeks.  Our staff will call the home number listed on your records the next business day following your procedure to check on you and address any questions or concerns that you may have at that time regarding the information given to you following your procedure. This is a courtesy call and so if there is no answer at the home number and we have not heard from you through the emergency physician on call, we will assume that you have returned to your regular daily activities without incident.  SIGNATURES/CONFIDENTIALITY: You and/or your care partner have signed paperwork which will be entered into your electronic medical record.  These signatures attest to the fact that that the information above on your After Visit Summary has been reviewed and is understood.  Full responsibility of the confidentiality   of this discharge information lies with you and/or your care-partner.     

## 2014-03-27 NOTE — Progress Notes (Signed)
Called to room to assist during endoscopic procedure.  Patient ID and intended procedure confirmed with present staff. Received instructions for my participation in the procedure from the performing physician.  

## 2014-03-27 NOTE — Progress Notes (Signed)
A/ox3 pleased with MAC, report to Karen RN 

## 2014-03-28 ENCOUNTER — Telehealth: Payer: Self-pay

## 2014-03-28 NOTE — Telephone Encounter (Signed)
  Follow up Call-  Call back number 03/27/2014  Post procedure Call Back phone  # [phone] cell  Permission to leave phone message Yes     Patient questions:  Do you have a fever, pain , or abdominal swelling? no Pain Score  0 *  Have you tolerated food without any problems? yes  Have you been able to return to your normal activities? yes  Do you have any questions about your discharge instructions: Diet   no Medications  no Follow up visit  no  Do you have questions or concerns about your Care? no  Actions: * If pain score is 4 or above: No action needed, pain <4.

## 2014-04-04 ENCOUNTER — Telehealth: Payer: Self-pay

## 2014-04-04 ENCOUNTER — Telehealth: Payer: Self-pay | Admitting: Internal Medicine

## 2014-04-04 ENCOUNTER — Encounter: Payer: Self-pay | Admitting: Internal Medicine

## 2014-04-04 ENCOUNTER — Other Ambulatory Visit: Payer: Self-pay

## 2014-04-04 DIAGNOSIS — D3A026 Benign carcinoid tumor of the rectum: Secondary | ICD-10-CM

## 2014-04-04 NOTE — Telephone Encounter (Signed)
Dr. 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  Patient is notified of the results and that Dr. Hilarie Fredrickson has contacted one of his partners Dr. Ardis Hughs to perform the removal.  He is aware he will be contacted by patty.  All questions answered

## 2014-04-04 NOTE — Telephone Encounter (Signed)
Flex scheduled, pt instructed and medications reviewed.  Patient instructions mailed to home.  Patient to call with any questions or concerns.  

## 2014-04-09 ENCOUNTER — Encounter: Payer: Self-pay | Admitting: Gastroenterology

## 2014-04-12 ENCOUNTER — Encounter (HOSPITAL_COMMUNITY): Payer: Self-pay | Admitting: *Deleted

## 2014-04-12 ENCOUNTER — Encounter (HOSPITAL_COMMUNITY)
Admission: RE | Disposition: A | Discharge status: Home / Self Care | Payer: Self-pay | Source: Ambulatory Visit | Attending: Gastroenterology

## 2014-04-12 ENCOUNTER — Ambulatory Visit (HOSPITAL_COMMUNITY)
Admission: RE | Admit: 2014-04-12 | Discharge: 2014-04-12 | Disposition: A | Discharge status: Home / Self Care | Payer: BC Managed Care – PPO | Source: Ambulatory Visit | Attending: Gastroenterology | Admitting: Gastroenterology

## 2014-04-12 DIAGNOSIS — D3A026 Benign carcinoid tumor of the rectum: Secondary | ICD-10-CM | POA: Diagnosis present

## 2014-04-12 HISTORY — PX: FLEXIBLE SIGMOIDOSCOPY: SHX5431

## 2014-04-12 SURGERY — SIGMOIDOSCOPY, FLEXIBLE
Anesthesia: Moderate Sedation

## 2014-04-12 MED ORDER — MIDAZOLAM HCL 10 MG/2ML IJ SOLN
INTRAMUSCULAR | Status: AC
  Filled 2014-04-12: qty 2

## 2014-04-12 MED ORDER — FENTANYL CITRATE 0.05 MG/ML IJ SOLN
INTRAMUSCULAR | Status: AC
  Filled 2014-04-12: qty 2

## 2014-04-12 MED ORDER — FENTANYL CITRATE 0.05 MG/ML IJ SOLN
INTRAMUSCULAR | Status: DC | PRN
  Administered 2014-04-12 (×2): 25 ug via INTRAVENOUS

## 2014-04-12 MED ORDER — SODIUM CHLORIDE 0.9 % IV SOLN: INTRAVENOUS | Status: DC

## 2014-04-12 MED ORDER — MIDAZOLAM HCL 10 MG/2ML IJ SOLN
INTRAMUSCULAR | Status: DC | PRN
  Administered 2014-04-12 (×2): 2.5 mg via INTRAVENOUS

## 2014-04-12 NOTE — Progress Notes (Signed)
0.9%saline 4cc injected undrer polyp  Prior to polypectomy .Marland Kitchen

## 2014-04-12 NOTE — Progress Notes (Signed)
Late entry Fentanyl  25 mg given IV @1420   And versed  IV 2mg   IV.for discomfort during procedure.

## 2014-04-12 NOTE — Op Note (Signed)
[facility] [place], [address]   FLEX SIGMOIDOSCOPY PROCEDURE REPORT  PATIENT: Martin Costa, Martin Costa  MR#: [id number] BIRTHDATE: [birth year] , 52  yrs. old GENDER: Male ENDOSCOPIST: Milus Banister, MD REFERRED BY: Zenovia Jarred, MD PROCEDURE DATE:  04/12/2014 PROCEDURE:   Sigmoidoscopy with snare, sigmoidoscopy with submucosal injection INDICATIONS:previously biopsied rectal carcinoid lesion (Dr. Hilarie Fredrickson). MEDICATIONS: Fentanyl 75 mcg IV and Versed 7 mg IV  DESCRIPTION OF PROCEDURE:    Physical exam was performed.  Informed consent was obtained from the patient after explaining the benefits, risks, and alternatives to procedure.  The patient was connected to monitor and placed in left lateral position. Continuous oxygen was provided by nasal cannula and IV medicine administered through an indwelling cannula.  After administration of sedation and rectal exam, the patients rectum was intubated and the EC-[id number] ([id number])  colonoscope was advanced under direct visualization to the cecum.  The scope was removed slowly by carefully examining the color, texture, anatomy, and integrity mucosa on the way out.  The patient was recovered in endoscopy and discharged home in satisfactory condition.   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  This showed evidence of recent biopsy.  The lesion was approximately 60mm aross and it was removed with snare/cautery after saline lift (submucosal injection).  The lesion appeared to be completely removed. 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  This showed evidence of recent biopsy.  The lesion was approximately 35mm aross and it was removed with snare/cautery after saline lift (submucosal injection).  The lesion appeared to be completely  removed.   RECOMMENDATIONS: Await final pathology results.  If margins are clear of carcinoid, he will not require dedicated surveillance.   _______________________________ eSigned:  Milus Banister, MD 04/12/2014 2:32 PM

## 2014-04-12 NOTE — H&P (Signed)
  HPI: This is a man who was recently found to have small rectal carcinoid during screening colonoscopy (Dr. Hilarie Fredrickson).   Past Medical History  Diagnosis Date  . Arthritis   . Bronchitis     hx of  . GERD (gastroesophageal reflux disease)     hx of    Past Surgical History  Procedure Laterality Date  . Foot surgery  2003    tendon repair  . Leg tendon surgery  2003    left  . Eye surgery  2006    left eyelid surgery  . Posterior cervical fusion/foraminotomy  12/15/2012    Procedure: POSTERIOR CERVICAL FUSION/FORAMINOTOMY LEVEL 3;  Surgeon: Melina Schools, MD;  Location: [place];  Service: Orthopedics;  Laterality: N/A;  POSTERIOR CERVICAL FUSION CERVICAL FOUR TO THORACIC TWO    Current Facility-Administered Medications  Medication Dose Route Frequency Provider Last Rate Last Dose  . 0.9 %  sodium chloride infusion   Intravenous Continuous Milus Banister, MD        Allergies as of 04/04/2014  . (No Known Allergies)    Family History  Problem Relation Age of Onset  . Arthritis Mother   . Hypertension Mother   . Kidney failure Mother   . Arthritis Father   . Cancer Father     ?prostate cancer  . Colon cancer Neg Hx   . Esophageal cancer Neg Hx   . Pancreatic cancer Neg Hx   . Rectal cancer Neg Hx   . Stomach cancer Neg Hx     History   Social History  . Marital Status: Single    Spouse Name: N/A    Number of Children: N/A  . Years of Education: N/A   Occupational History  . Not on file.   Social History Main Topics  . Smoking status: Never Smoker   . Smokeless tobacco: Never Used  . Alcohol Use: Yes     Comment: "social"  . Drug Use: No  . Sexual Activity: Not on file   Other Topics Concern  . Not on file   Social History Narrative  . No narrative on file      Physical Exam: There were no vitals taken for this visit. Constitutional: generally well-appearing Psychiatric: alert and oriented x3 Abdomen: soft, nontender, nondistended, no obvious  ascites, no peritoneal signs, normal bowel sounds     Assessment and plan: 53 y.o. male with small rectal carcnoid  For flex sig, EMR of the rectal carcinoid

## 2014-04-13 ENCOUNTER — Encounter (HOSPITAL_COMMUNITY): Payer: Self-pay | Admitting: Gastroenterology

## 2014-08-26 IMAGING — CR DG CERVICAL SPINE 2 OR 3 VIEWS
3 series · 3 of 3 positions shown · non-contrast
Comparison: Intraoperative imaging 12/15/2012

CLINICAL DATA: Postop C5 fracture.

CERVICAL SPINE - 2-3 VIEW

[w cervical spine lat]
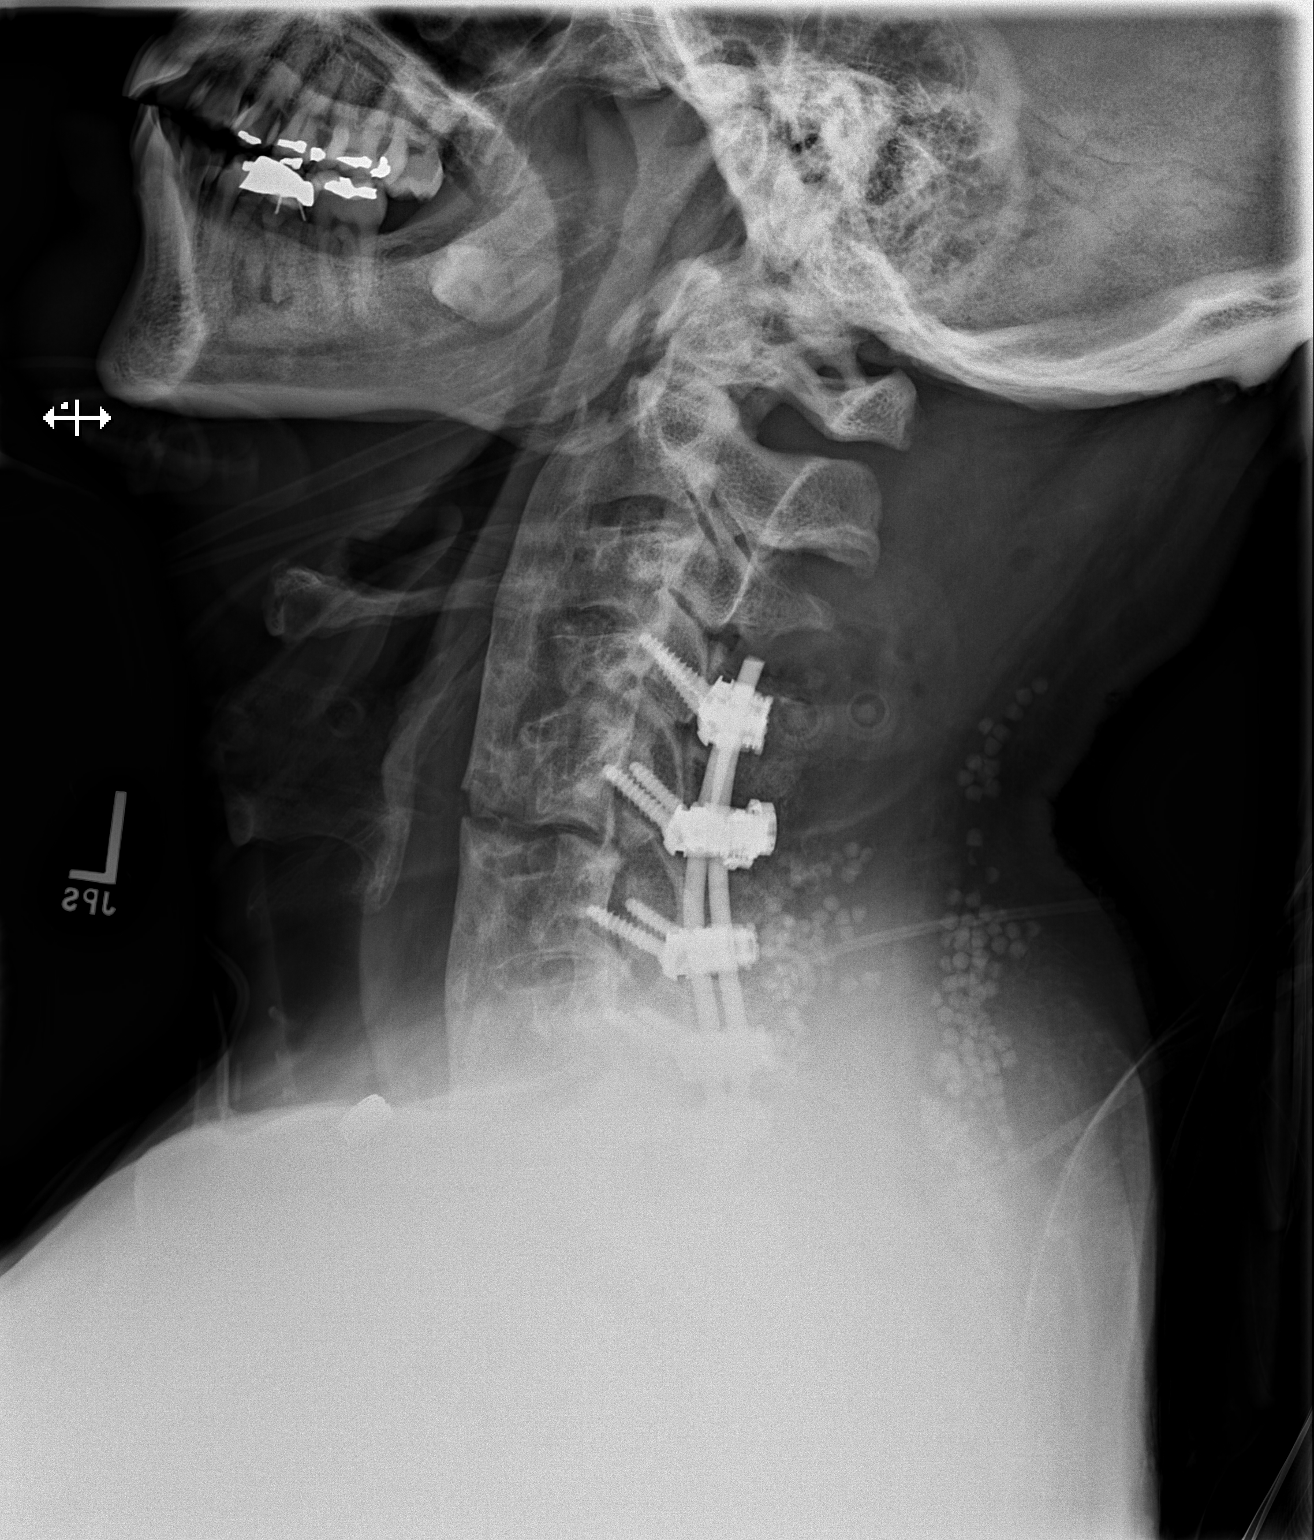

[w cervical swimmers]
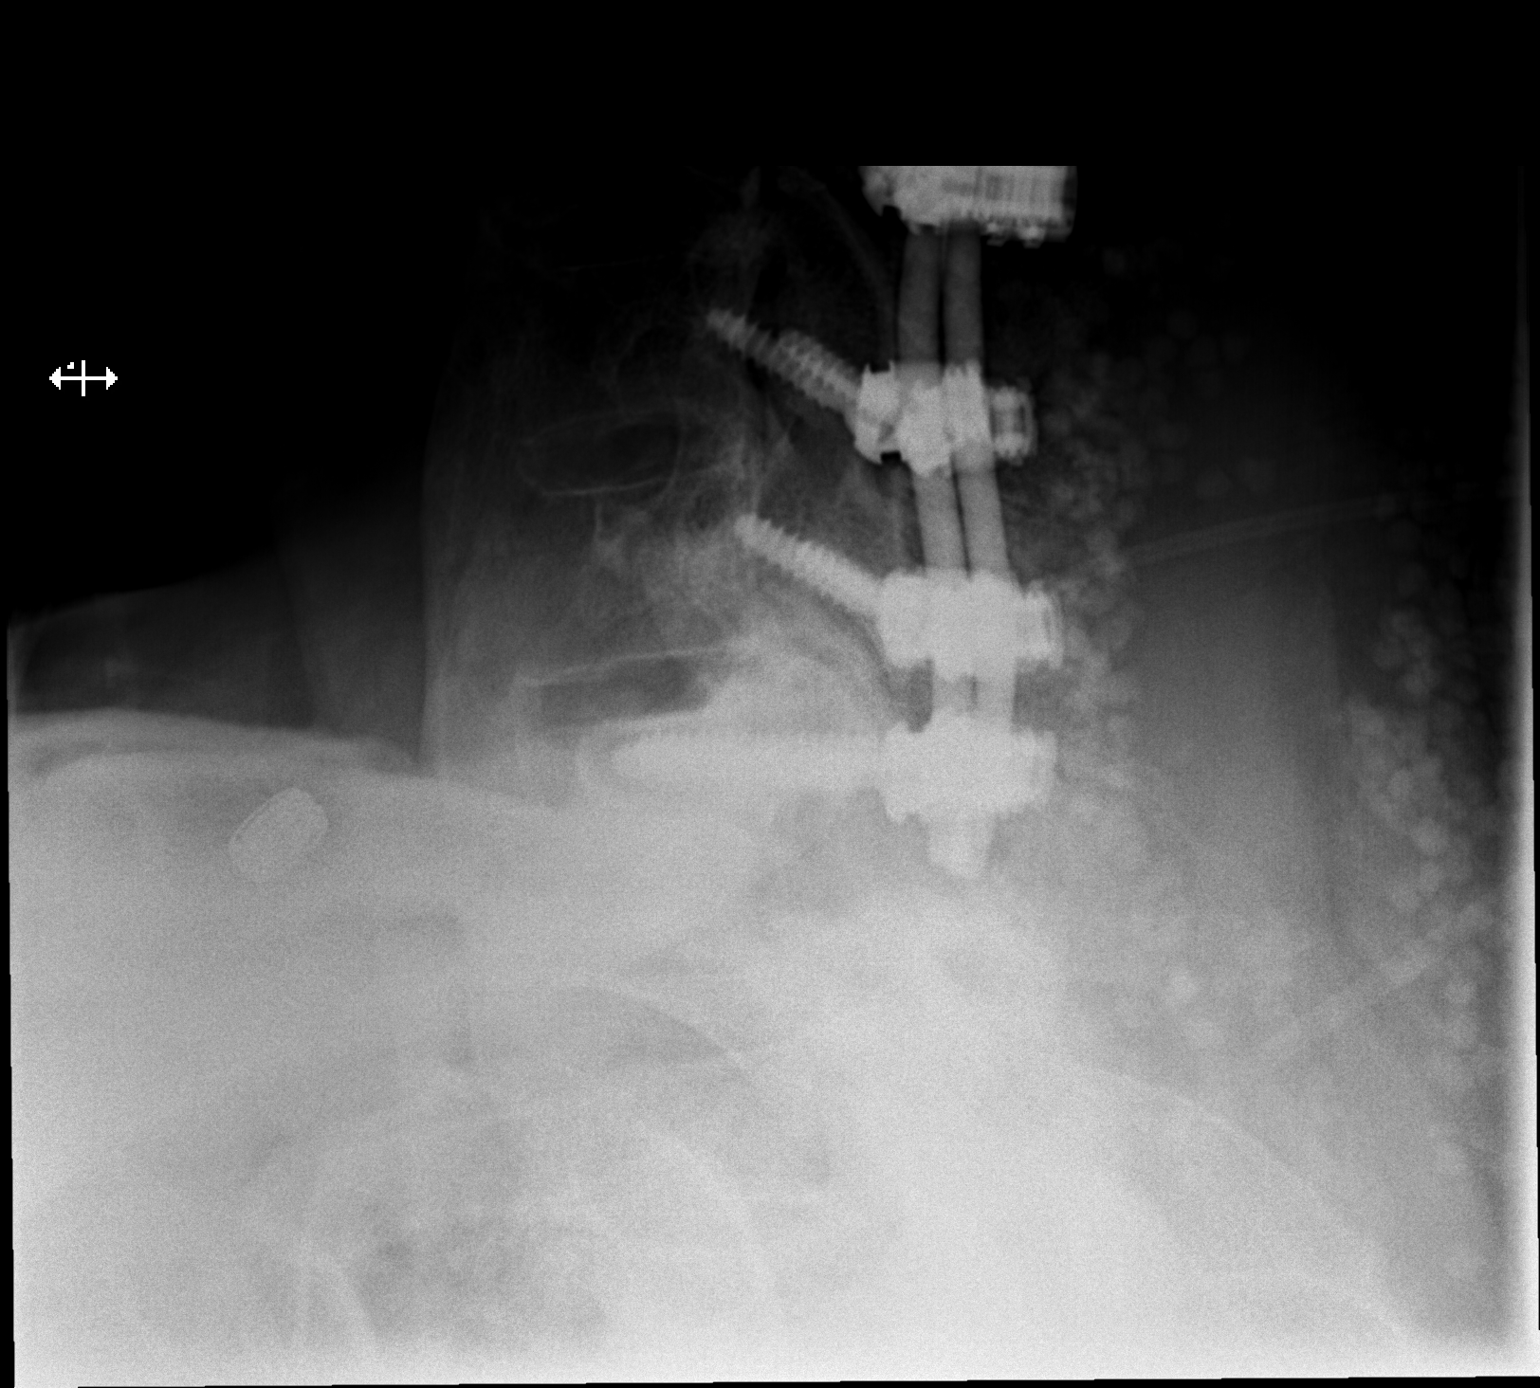

[x cervical spine 0-3yrs (4-8cm)]
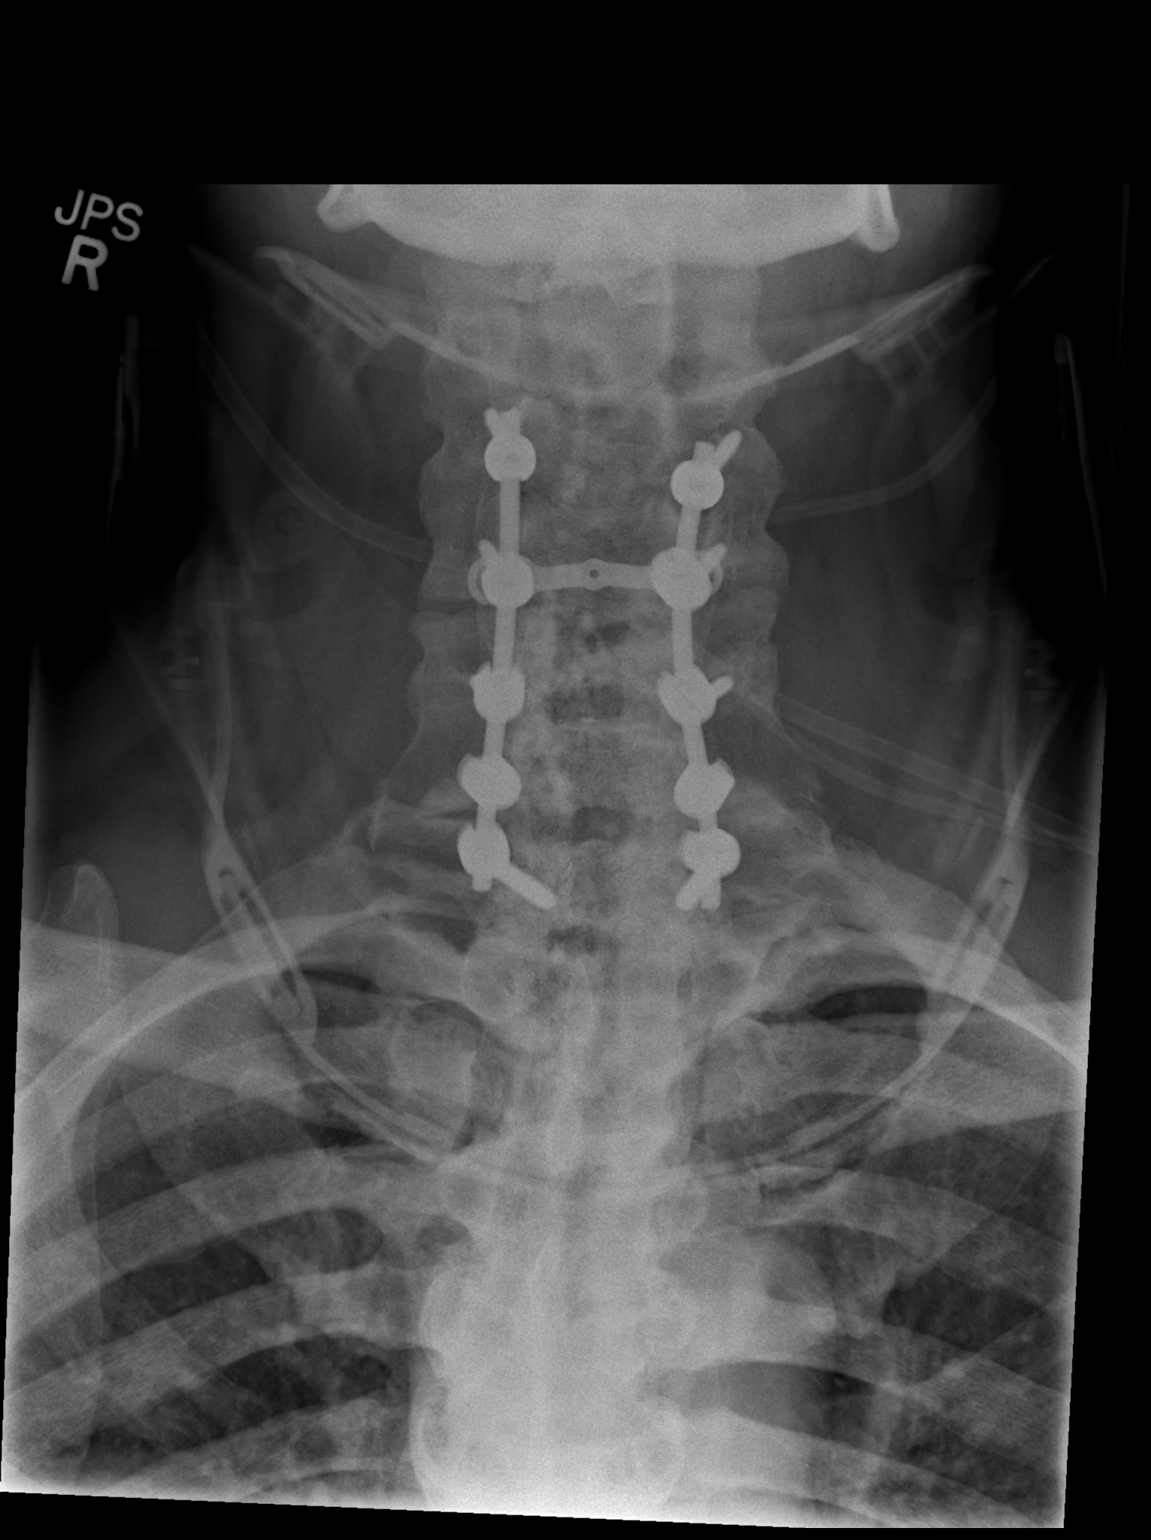

[3 of 3 positions shown; findings below may reference images not displayed]

FINDINGS: Changes of posterior fusion noted from C4-T1.  Anatomic
alignment.  No complicating feature.
IMPRESSION: Posterior fusion C4-T1.

## 2014-11-28 ENCOUNTER — Ambulatory Visit (INDEPENDENT_AMBULATORY_CARE_PROVIDER_SITE_OTHER): Payer: BC Managed Care – PPO | Admitting: Family Medicine

## 2014-11-28 ENCOUNTER — Encounter: Payer: Self-pay | Admitting: Family Medicine

## 2014-11-28 VITALS — BP 126/80 | HR 83 | Temp 98.0°F | Wt 271.0 lb

## 2014-11-28 DIAGNOSIS — M1000 Idiopathic gout, unspecified site: Secondary | ICD-10-CM

## 2014-11-28 DIAGNOSIS — N528 Other male erectile dysfunction: Secondary | ICD-10-CM

## 2014-11-28 MED ORDER — SILDENAFIL CITRATE 100 MG PO TABS
50.0000 mg | ORAL_TABLET | Freq: Every day | ORAL | Status: DC | PRN
Start: 2014-11-28 — End: 2017-05-11

## 2014-11-28 MED ORDER — COLCHICINE 0.6 MG PO TABS: ORAL_TABLET | ORAL | Status: DC

## 2014-11-28 MED ORDER — PREDNISONE 20 MG PO TABS
ORAL_TABLET | ORAL | Status: DC
Start: 2014-11-28 — End: 2015-03-12

## 2014-11-28 NOTE — Progress Notes (Signed)
   Subjective:    Patient ID: Martin Costa, male    DOB: 01-20-1961, 53 y.o.   MRN: [id number]  HPI Patient seen for the following issues  Acute gout left foot. History of gout past but no flare ups in about 5 years. He is not interested in prophylaxis. No recent alcohol use. No injury. No fevers or chills. Previous involvement left MTP joint. Tremendous pain with ambulation. Has had previous involvement of elbows and hands  Erectile dysfunction. Progressive past couple of years. Requesting trial of Viagra. Takes no regular prescription medications. He had neck injury over year ago with fracture and has had gradual weight gain since then and no real exercise. Nonsmoker. No history of peripheral vascular disease.  Past Medical History  Diagnosis Date  . Arthritis   . Bronchitis     hx of  . GERD (gastroesophageal reflux disease)     hx of   Past Surgical History  Procedure Laterality Date  . Foot surgery  2003    tendon repair  . Leg tendon surgery  2003    left  . Eye surgery  2006    left eyelid surgery  . Posterior cervical fusion/foraminotomy  12/15/2012    Procedure: POSTERIOR CERVICAL FUSION/FORAMINOTOMY LEVEL 3;  Surgeon: Melina Schools, MD;  Location: [place];  Service: Orthopedics;  Laterality: N/A;  POSTERIOR CERVICAL FUSION CERVICAL FOUR TO THORACIC TWO  . Flexible sigmoidoscopy N/A 04/12/2014    Procedure: FLEXIBLE SIGMOIDOSCOPY;  Surgeon: Milus Banister, MD;  Location: [facility];  Service: Endoscopy;  Laterality: N/A;    reports that he has never smoked. He has never used smokeless tobacco. He reports that he drinks alcohol. He reports that he does not use illicit drugs. family history includes Arthritis in his father and mother; Cancer in his father; Hypertension in his mother; Kidney failure in his mother. There is no history of Colon cancer, Esophageal cancer, Pancreatic cancer, Rectal cancer, or Stomach cancer. No Known Allergies    Review of Systems    Constitutional: Negative for fever and chills.  Respiratory: Negative for cough and shortness of breath.   Cardiovascular: Negative for chest pain.  Skin: Negative for rash.  Neurological: Negative for dizziness.  Hematological: Negative for adenopathy.       Objective:   Physical Exam  Constitutional: He appears well-developed and well-nourished.  Neck: Neck supple. No thyromegaly present.  Cardiovascular: Normal rate and regular rhythm.   Pulmonary/Chest: Effort normal and breath sounds normal. No respiratory distress. He has no wheezes. He has no rales.  Musculoskeletal:  Left foot reveals mild edema MTP joint. Mild erythema. Minimal warmth. Tender to palpation.          Assessment & Plan:  #1 acute gout left foot. Stay well-hydrated. Prednisone 40 mg daily over the next 6 days and colchicine 0.6 mg 2 at onset than one every 12 hours as needed. Handout given. We discussed pros and cons of prophylaxis at this point not interested and he has had very infrequent attacks. #2 erectile dysfunction. Trial of Viagra 100 mg one half to 1 tablet daily as needed. He has no contraindications.

## 2014-11-28 NOTE — Progress Notes (Signed)
Pre visit review using our clinic review tool, if applicable. No additional management support is needed unless otherwise documented below in the visit note. 

## 2014-11-28 NOTE — Patient Instructions (Signed)

## 2015-03-01 ENCOUNTER — Telehealth: Payer: Self-pay | Admitting: Family Medicine

## 2015-03-01 NOTE — Telephone Encounter (Signed)
PA for Colchicine was denied. Patient must try and fail Mitigare capsules first.

## 2015-03-03 NOTE — Telephone Encounter (Signed)
OK to use Mitigare (which is colchicine) 0.6 mg po bid prn gout

## 2015-03-04 MED ORDER — COLCHICINE 0.6 MG PO CAPS: 0.6000 mg | ORAL_CAPSULE | Freq: Two times a day (BID) | ORAL | Status: DC | PRN

## 2015-03-04 NOTE — Telephone Encounter (Signed)
Rx sent to pharmacy   

## 2015-03-11 ENCOUNTER — Telehealth: Payer: Self-pay | Admitting: Family Medicine

## 2015-03-11 NOTE — Telephone Encounter (Signed)
Left message for pt to call back to schedule appt

## 2015-03-11 NOTE — Telephone Encounter (Signed)
Pt calling back b/c he is in terrible pain. Needs allopurinol called to pharmacy as soon as possible please.

## 2015-03-11 NOTE — Telephone Encounter (Signed)
Pt has been scheduled.  °

## 2015-03-11 NOTE — Telephone Encounter (Signed)
Pt ins will cover allopurinol not colchicine. Please call new rx into [facility]

## 2015-03-11 NOTE — Telephone Encounter (Signed)
Allopurinol is not for acute gout and, in fact, could make this worse.  Recommend follow-up. May need acute use of prednisone if colchicine not covered. We can discuss prophylaxis but this should never be started during an acute attack

## 2015-03-12 ENCOUNTER — Encounter: Payer: Self-pay | Admitting: Family Medicine

## 2015-03-12 ENCOUNTER — Ambulatory Visit (INDEPENDENT_AMBULATORY_CARE_PROVIDER_SITE_OTHER): Payer: BLUE CROSS/BLUE SHIELD | Admitting: Family Medicine

## 2015-03-12 VITALS — BP 128/80 | HR 80 | Temp 98.3°F | Wt 261.0 lb

## 2015-03-12 DIAGNOSIS — M1000 Idiopathic gout, unspecified site: Secondary | ICD-10-CM

## 2015-03-12 DIAGNOSIS — M109 Gout, unspecified: Secondary | ICD-10-CM | POA: Insufficient documentation

## 2015-03-12 MED ORDER — PREDNISONE 20 MG PO TABS: ORAL_TABLET | ORAL | Status: DC

## 2015-03-12 MED ORDER — ALLOPURINOL 100 MG PO TABS: ORAL_TABLET | ORAL | Status: DC

## 2015-03-12 NOTE — Progress Notes (Signed)
   Subjective:    Patient ID: Martin Costa, male    DOB: [birth year], 54 y.o.   MRN: [id number]  HPI Patient here with recurrent acute gout right ankle. He's had previous occurrence metatarsophalangeal joint as well as elbow. Current flare started about a week ago. He is not aware of any specific dietary triggers. No fevers or chills. No injury. He's had swelling, warmth, and mild redness. He has been taking brand of colchicine (Mitigare) but this has not helped.  Previously spotted to prednisone very well. No regular alcohol use.  Past Medical History  Diagnosis Date  . Arthritis   . Bronchitis     hx of  . GERD (gastroesophageal reflux disease)     hx of   Past Surgical History  Procedure Laterality Date  . Foot surgery  2003    tendon repair  . Leg tendon surgery  2003    left  . Eye surgery  2006    left eyelid surgery  . Posterior cervical fusion/foraminotomy  12/15/2012    Procedure: POSTERIOR CERVICAL FUSION/FORAMINOTOMY LEVEL 3;  Surgeon: Melina Schools, MD;  Location: [place];  Service: Orthopedics;  Laterality: N/A;  POSTERIOR CERVICAL FUSION CERVICAL FOUR TO THORACIC TWO  . Flexible sigmoidoscopy N/A 04/12/2014    Procedure: FLEXIBLE SIGMOIDOSCOPY;  Surgeon: Milus Banister, MD;  Location: [facility];  Service: Endoscopy;  Laterality: N/A;    reports that he has never smoked. He has never used smokeless tobacco. He reports that he drinks alcohol. He reports that he does not use illicit drugs. family history includes Arthritis in his father and mother; Cancer in his father; Hypertension in his mother; Kidney failure in his mother. There is no history of Colon cancer, Esophageal cancer, Pancreatic cancer, Rectal cancer, or Stomach cancer. No Known Allergies    Review of Systems  Constitutional: Negative for fever and chills.  Musculoskeletal: Positive for arthralgias (Right ankle).       Objective:   Physical Exam  Constitutional: He appears well-developed and  well-nourished. No distress.  Cardiovascular: Normal rate and regular rhythm.   Pulmonary/Chest: Effort normal and breath sounds normal. No respiratory distress. He has no wheezes. He has no rales.  Musculoskeletal:  Right ankle reveals moderate effusion. Warm to palpation. Diffusely tender. No bony tenderness          Assessment & Plan:  Acute gout involving right ankle. He's had more frequent flareups during the past year. He is definitely interested in prevention. We explained starting preventative would not be advised during acute flare. Prednisone taper for acute flare. After this has settled down for a few weeks start gradual buildup to 300 mg daily. Colchicine 0.6 mg 1 daily during buildup. We'll plan uric acid level about 3 months after getting to target dose of allopurinol 300 mg with a goal uric acid level less than 6

## 2015-03-12 NOTE — Progress Notes (Signed)
Pre visit review using our clinic review tool, if applicable. No additional management support is needed unless otherwise documented below in the visit note. 

## 2015-03-12 NOTE — Patient Instructions (Signed)

## 2015-08-02 ENCOUNTER — Other Ambulatory Visit: Payer: Self-pay | Admitting: Family Medicine

## 2015-08-09 ENCOUNTER — Ambulatory Visit: Payer: BLUE CROSS/BLUE SHIELD | Admitting: Family Medicine

## 2015-08-09 ENCOUNTER — Ambulatory Visit (INDEPENDENT_AMBULATORY_CARE_PROVIDER_SITE_OTHER): Payer: BLUE CROSS/BLUE SHIELD | Admitting: Family Medicine

## 2015-08-09 ENCOUNTER — Encounter: Payer: Self-pay | Admitting: Family Medicine

## 2015-08-09 VITALS — BP 132/90 | HR 79 | Temp 98.0°F | Wt 271.0 lb

## 2015-08-09 DIAGNOSIS — M10022 Idiopathic gout, left elbow: Secondary | ICD-10-CM | POA: Diagnosis not present

## 2015-08-09 DIAGNOSIS — M542 Cervicalgia: Secondary | ICD-10-CM

## 2015-08-09 DIAGNOSIS — M109 Gout, unspecified: Secondary | ICD-10-CM

## 2015-08-09 MED ORDER — PREDNISONE 20 MG PO TABS
ORAL_TABLET | ORAL | Status: DC
Start: 2015-08-09 — End: 2017-05-11

## 2015-08-09 NOTE — Progress Notes (Signed)
Pre visit review using our clinic review tool, if applicable. No additional management support is needed unless otherwise documented below in the visit note. 

## 2015-08-09 NOTE — Patient Instructions (Signed)
Stay well hydrated Start back the allopurinol but wait until at least one week after acute attack subsides Allopurinol- increase gradually by one extra tablet week up to the 300 mg dose Come in for uric acid level 2-3 weeks after getting back to 300 mg dose.

## 2015-08-09 NOTE — Progress Notes (Signed)
Subjective:    Patient ID: Martin Costa, male    DOB: Aug 12, 1961, 54 y.o.   MRN: [id number]  HPI Patient seen for follow-up regarding recent gout. He has a long history of gout involving multiple joints. Recent involvement left elbow. We recently initiated allopurinol and currently on 300 mg daily but surprisingly has had some flareups even on mat. Unfortunately, he misunderstood and stopped his allopurinol during recent flareup. He took prednisone last week which is approximately did not seem to help much. He's not taking colchrys because of cost. He is not aware of any recent dietary triggers. He stays well-hydrated. No alcohol use.  Patient also complains of some chronic neck stiffness and lower cervical pain with diffuse upper back pain. He has history of extensive neck surgery with multilevel fusion. He has daily stiffness. Denies radiculopathy symptoms. No upper extremity weakness or numbness. Patient wishes to see his neurosurgeon can for repeat evaluation. He is apparently not seen him a couple years now  Past Medical History  Diagnosis Date  . Arthritis   . Bronchitis     hx of  . GERD (gastroesophageal reflux disease)     hx of   Past Surgical History  Procedure Laterality Date  . Foot surgery  2003    tendon repair  . Leg tendon surgery  2003    left  . Eye surgery  2006    left eyelid surgery  . Posterior cervical fusion/foraminotomy  12/15/2012    Procedure: POSTERIOR CERVICAL FUSION/FORAMINOTOMY LEVEL 3;  Surgeon: Melina Schools, MD;  Location: [place];  Service: Orthopedics;  Laterality: N/A;  POSTERIOR CERVICAL FUSION CERVICAL FOUR TO THORACIC TWO  . Flexible sigmoidoscopy N/A 04/12/2014    Procedure: FLEXIBLE SIGMOIDOSCOPY;  Surgeon: Milus Banister, MD;  Location: [facility];  Service: Endoscopy;  Laterality: N/A;    reports that he has never smoked. He has never used smokeless tobacco. He reports that he drinks alcohol. He reports that he does not use illicit  drugs. family history includes Arthritis in his father and mother; Cancer in his father; Hypertension in his mother; Kidney failure in his mother. There is no history of Colon cancer, Esophageal cancer, Pancreatic cancer, Rectal cancer, or Stomach cancer. No Known Allergies    Review of Systems  Constitutional: Negative for fever and chills.  Respiratory: Negative for shortness of breath.   Cardiovascular: Negative for chest pain.  Musculoskeletal: Positive for arthralgias. Negative for myalgias.  Skin: Negative for rash.  Neurological: Negative for dizziness, weakness and numbness.       Objective:   Physical Exam  Constitutional: He appears well-developed and well-nourished.  Cardiovascular: Normal rate and regular rhythm.   Pulmonary/Chest: Effort normal and breath sounds normal. No respiratory distress. He has no wheezes. He has no rales.  Musculoskeletal:  Left elbow is slightly warm to touch with minimal edema Full range of motion. He has some mild tenderness over the bursa region. No cellulitis changes  Neurological:  No focal upper extremity weakness.          Assessment & Plan:  Probable acute gout left elbow. He was given another prednisone taper. He's tried non-steroidal's in the past without much improvement. We have again stressed importance of taking allopurinol regularly. We have advised not starting this back until this flareup subsides but didn't start titration regimen after he has been on 300 mg for several weeks check uric acid to see where he stands a week and gauge whether to titrate further  Cervical neck pain. History of prior fusion. He complains of some more or less chronic lower cervical neck pain and upper back pain. We've advised follow-up with his neurosurgeon to further evaluate

## 2015-10-04 ENCOUNTER — Other Ambulatory Visit (INDEPENDENT_AMBULATORY_CARE_PROVIDER_SITE_OTHER): Payer: BLUE CROSS/BLUE SHIELD

## 2015-10-04 DIAGNOSIS — M1000 Idiopathic gout, unspecified site: Secondary | ICD-10-CM | POA: Diagnosis not present

## 2015-10-04 LAB — URIC ACID: Uric Acid, Serum: 4.9 mg/dL (ref 4.0–7.8)

## 2015-12-28 ENCOUNTER — Emergency Department (INDEPENDENT_AMBULATORY_CARE_PROVIDER_SITE_OTHER): Payer: BLUE CROSS/BLUE SHIELD

## 2015-12-28 ENCOUNTER — Emergency Department (INDEPENDENT_AMBULATORY_CARE_PROVIDER_SITE_OTHER)
Admission: EM | Admit: 2015-12-28 | Discharge: 2015-12-28 | Disposition: A | Discharge status: Home / Self Care | Payer: BLUE CROSS/BLUE SHIELD | Source: Home / Self Care

## 2015-12-28 ENCOUNTER — Encounter (HOSPITAL_COMMUNITY): Payer: Self-pay | Admitting: Emergency Medicine

## 2015-12-28 DIAGNOSIS — R10A0 Flank pain, unspecified side: Secondary | ICD-10-CM

## 2015-12-28 DIAGNOSIS — R21 Rash and other nonspecific skin eruption: Secondary | ICD-10-CM

## 2015-12-28 DIAGNOSIS — R109 Unspecified abdominal pain: Secondary | ICD-10-CM | POA: Diagnosis not present

## 2015-12-28 DIAGNOSIS — IMO0001 Reserved for inherently not codable concepts without codable children: Secondary | ICD-10-CM

## 2015-12-28 DIAGNOSIS — R1031 Right lower quadrant pain: Secondary | ICD-10-CM

## 2015-12-28 DIAGNOSIS — R030 Elevated blood-pressure reading, without diagnosis of hypertension: Secondary | ICD-10-CM | POA: Diagnosis not present

## 2015-12-28 LAB — POCT URINALYSIS DIP (DEVICE)
Bilirubin Urine: NEGATIVE
GLUCOSE, UA: NEGATIVE mg/dL
Hgb urine dipstick: NEGATIVE
Ketones, ur: NEGATIVE mg/dL
Leukocytes, UA: NEGATIVE
NITRITE: NEGATIVE
PH: 6 (ref 5.0–8.0)
Protein, ur: NEGATIVE mg/dL
Specific Gravity, Urine: 1.015 (ref 1.005–1.030)
UROBILINOGEN UA: 0.2 mg/dL (ref 0.0–1.0)

## 2015-12-28 MED ORDER — TRAMADOL HCL 50 MG PO TABS
50.0000 mg | ORAL_TABLET | Freq: Four times a day (QID) | ORAL | Status: DC | PRN
Start: 2015-12-28 — End: 2017-08-11

## 2015-12-28 MED ORDER — VALACYCLOVIR HCL 1 G PO TABS: 1000.0000 mg | ORAL_TABLET | Freq: Three times a day (TID) | ORAL | Status: AC

## 2015-12-28 NOTE — ED Provider Notes (Signed)
CSN: [id number]     Arrival date & time 12/28/15  1334 History   None    No chief complaint on file.  (Consider location/radiation/quality/duration/timing/severity/associated sxs/prior Treatment) Patient is a 55 y.o. male presenting with flank pain, hypertension, and rash. The history is provided by the patient. No language interpreter was used.  Flank Pain This is a new problem. The current episode started more than 2 days ago (He started having pain on his right lower abdominal quadrant radiating to the right flank, started about 4 days ago). The problem occurs constantly. The problem has been gradually worsening. Pertinent negatives include no chest pain, no headaches and no shortness of breath. The symptoms are aggravated by walking (laying down on it). Nothing relieves the symptoms. He has tried acetaminophen for the symptoms. The treatment provided no relief.  Hypertension This is a new problem. Episode onset: Uncertain, never been diagnosed with HTN. Pertinent negatives include no chest pain, no headaches and no shortness of breath. Nothing aggravates the symptoms. Nothing relieves the symptoms. He has tried nothing for the symptoms.  Rash Location: 3 days ago he noticed new rash over his right lower abdominal quadrant just over the place where he is having pain. He denies any itching. Quality: redness   Quality: not weeping   Quality comment:  3 small bumps, red, not increasing in size Severity:  Mild Onset quality:  Gradual Duration:  3 days Progression:  Spreading Chronicity:  New Context: not animal contact, not insect bite/sting, not medications and not sick contacts   Relieved by:  Nothing Worsened by:  Nothing tried Associated symptoms: no headaches and no shortness of breath     Past Medical History  Diagnosis Date  . Arthritis   . Bronchitis     hx of  . GERD (gastroesophageal reflux disease)     hx of   Past Surgical History  Procedure Laterality Date  . Foot  surgery  2003    tendon repair  . Leg tendon surgery  2003    left  . Eye surgery  2006    left eyelid surgery  . Posterior cervical fusion/foraminotomy  12/15/2012    Procedure: POSTERIOR CERVICAL FUSION/FORAMINOTOMY LEVEL 3;  Surgeon: Melina Schools, MD;  Location: [place];  Service: Orthopedics;  Laterality: N/A;  POSTERIOR CERVICAL FUSION CERVICAL FOUR TO THORACIC TWO  . Flexible sigmoidoscopy N/A 04/12/2014    Procedure: FLEXIBLE SIGMOIDOSCOPY;  Surgeon: Milus Banister, MD;  Location: [facility];  Service: Endoscopy;  Laterality: N/A;   Family History  Problem Relation Age of Onset  . Arthritis Mother   . Hypertension Mother   . Kidney failure Mother   . Arthritis Father   . Cancer Father     ?prostate cancer  . Colon cancer Neg Hx   . Esophageal cancer Neg Hx   . Pancreatic cancer Neg Hx   . Rectal cancer Neg Hx   . Stomach cancer Neg Hx    Social History  Substance Use Topics  . Smoking status: Never Smoker   . Smokeless tobacco: Never Used  . Alcohol Use: Yes     Comment: "social"    Review of Systems  Respiratory: Negative.  Negative for shortness of breath.   Cardiovascular: Negative.  Negative for chest pain.  Gastrointestinal: Negative.   Genitourinary: Positive for flank pain.  Skin: Positive for rash.  Neurological: Negative for headaches.  All other systems reviewed and are negative.   Allergies  Review of patient's allergies indicates no  known allergies.  Home Medications   Prior to Admission medications   Medication Sig Start Date End Date Taking? Authorizing Provider  acetaminophen (TYLENOL) 500 MG tablet Take 500 mg by mouth every 6 (six) hours as needed.    Historical Provider, MD  allopurinol (ZYLOPRIM) 100 MG tablet Take one daily for 2 weeks and then two daily for 2 weeks and then three daily. 03/12/15   Eulas Post, MD  predniSONE (DELTASONE) 20 MG tablet Taper as follows: 3-3-3-2-2-2-1-1 08/09/15   Eulas Post, MD  sildenafil  (VIAGRA) 100 MG tablet Take 0.5-1 tablets (50-100 mg total) by mouth daily as needed for erectile dysfunction. 11/28/14   Eulas Post, MD   Meds Ordered and Administered this Visit  Medications - No data to display  There were no vitals taken for this visit. No data found.  Filed Vitals:   12/28/15 1435  BP: 195/111  Pulse: 66  Temp: 98 F (36.7 C)  TempSrc: Oral  Resp: 20  SpO2: 100%    Physical Exam  Constitutional: He is oriented to person, place, and time. He appears well-developed. No distress.  Cardiovascular: Normal rate, regular rhythm, normal heart sounds and intact distal pulses.   No murmur heard. Pulmonary/Chest: Effort normal and breath sounds normal. No respiratory distress. He has no wheezes.  Abdominal: Soft. Bowel sounds are normal. He exhibits no distension and no mass. There is no tenderness.  Musculoskeletal: Normal range of motion. He exhibits no edema.  Neurological: He is alert and oriented to person, place, and time. No cranial nerve deficit.  Skin:     Nursing note and vitals reviewed.   ED Course  Procedures (including critical care time)  Labs Review Labs Reviewed - No data to display  Imaging Review No results found.   Visual Acuity Review  Right Eye Distance:   Left Eye Distance:   Bilateral Distance:    Right Eye Near:   Left Eye Near:    Bilateral Near:    Dg Abd 1 View  12/28/2015  CLINICAL DATA:  Right flank and lower quadrant pain for 4 days. EXAM: ABDOMEN - 1 VIEW COMPARISON:  None. FINDINGS: The bowel gas pattern is normal. No radio-opaque calculi or other significant radiographic abnormality are seen. Lumbar spine degenerative changes and mild bilateral hip osteoarthritis noted. IMPRESSION: Normal bowel gas pattern.  No acute findings. Electronically Signed   By: Earle Gell M.D.   On: 12/28/2015 15:48   Urinalysis    Component Value Date/Time   LABSPEC 1.015 12/28/2015 1445   PHURINE 6.0 12/28/2015 1445   GLUCOSEU  NEGATIVE 12/28/2015 1445   HGBUR NEGATIVE 12/28/2015 1445   BILIRUBINUR NEGATIVE 12/28/2015 1445   BILIRUBINUR n 01/04/2014 1401   KETONESUR NEGATIVE 12/28/2015 1445   PROTEINUR NEGATIVE 12/28/2015 1445   PROTEINUR n 01/04/2014 1401   UROBILINOGEN 0.2 12/28/2015 1445   UROBILINOGEN 0.2 01/04/2014 1401   NITRITE NEGATIVE 12/28/2015 1445   NITRITE n 01/04/2014 1401   LEUKOCYTESUR NEGATIVE 12/28/2015 1445     Filed Vitals:   12/28/15 1435 12/28/15 1526  BP: 195/111 164/93  Pulse: 66   Temp: 98 F (36.7 C)   TempSrc: Oral   Resp: 20   SpO2: 100%         MDM  No diagnosis found. Flank pain  Right lower quadrant abdominal pain  Skin rash  Elevated blood pressure   Etiology of flank pain and belly pain unclear. UA normal. KUB normal. I worry he might  be developing shingles as a cause of his pain especially with rash over the site of his pain although no rash on his back yet.  I advised he starts valtrex if his pain does not get better or rash is spreading. Tramadol given prn pain. He is instructed to go to the ED for CT scan if symptoms worsens.  BP elevated at the beginning of the visit. Repeat BP done improved some. I recommended f/u with PCP for BP management.   Kinnie Feil, MD 12/28/15 [phone]

## 2015-12-28 NOTE — Discharge Instructions (Signed)
°  It was nice seeing you today. I am sorry about your pain. Xray done today was negative and your urine looks fine. I doubt there is anything going on with your kidney. I worry about the rash over the area of your pain. It could be that you are developing shingles. If pain is not getting better or rash worsens please start the antiviral medication prescribed. Use tramadol as needed for pain and see [place] soon if not getting better.  Abdominal Pain, Adult Many things can cause belly (abdominal) pain. Most times, the belly pain is not dangerous. Many cases of belly pain can be watched and treated at home. HOME CARE   Do not take medicines that help you go poop (laxatives) unless told to by your doctor.  Only take medicine as told by your doctor.  Eat or drink as told by your doctor. Your doctor will tell you if you should be on a special diet. GET HELP IF:  You do not know what is causing your belly pain.  You have belly pain while you are sick to your stomach (nauseous) or have runny poop (diarrhea).  You have pain while you pee or poop.  Your belly pain wakes you up at night.  You have belly pain that gets worse or better when you eat.  You have belly pain that gets worse when you eat fatty foods.  You have a fever. GET HELP RIGHT AWAY IF:   The pain does not go away within 2 hours.  You keep throwing up (vomiting).  The pain changes and is only in the right or left part of the belly.  You have bloody or tarry looking poop. MAKE SURE YOU:   Understand these instructions.  Will watch your condition.  Will get help right away if you are not doing well or get worse.   This information is not intended to replace advice given to you by your health care provider. Make sure you discuss any questions you have with your health care provider.   Document Released: 05/18/2008 Document Revised: 12/21/2014 Document Reviewed: 08/09/2013 Elsevier Interactive Patient Education [employer]

## 2015-12-28 NOTE — ED Notes (Signed)
Here with constant right lower quad stabbing pain radiating to R lower back Started 4 days ago, denies n/v,chills or fever Tried otc pain reliever BP 195/111, denies Hx HTN

## 2015-12-30 ENCOUNTER — Ambulatory Visit (INDEPENDENT_AMBULATORY_CARE_PROVIDER_SITE_OTHER): Payer: BLUE CROSS/BLUE SHIELD | Admitting: Family Medicine

## 2015-12-30 ENCOUNTER — Encounter: Payer: Self-pay | Admitting: Family Medicine

## 2015-12-30 VITALS — BP 150/96 | HR 85 | Temp 98.2°F | Ht 71.0 in | Wt 277.7 lb

## 2015-12-30 DIAGNOSIS — B029 Zoster without complications: Secondary | ICD-10-CM

## 2015-12-30 MED ORDER — GABAPENTIN 300 MG PO CAPS: 300.0000 mg | ORAL_CAPSULE | Freq: Three times a day (TID) | ORAL | Status: DC

## 2015-12-30 NOTE — Progress Notes (Signed)
Pre visit review using our clinic review tool, if applicable. No additional management support is needed unless otherwise documented below in the visit note. 

## 2015-12-30 NOTE — Patient Instructions (Signed)

## 2015-12-30 NOTE — Progress Notes (Signed)
   Subjective:    Patient ID: Martin Costa, male    DOB: 10/17/1962, 55 y.o.   MRN: [id number]  HPI  patient seen for follow-up right flank pain. Onset about a week ago. 2 days ago he went to urgent care and presumptive diagnosis of shingles. KUB and urinalysis were unremarkable. Patient describes burning pain which radiates and a dermatome fashion from his lower thoracic area to the midline of abdomen. He was started on Valtrex couple days ago. He describes a burning stinging type pain 10 out 10 severity. Was prescribed tramadol which has not helped. No fevers or chills. No dysuria. No alleviating factors  He did note minimal blistery rash right flank a few days ago  Past Medical History  Diagnosis Date  . Arthritis   . Bronchitis     hx of  . GERD (gastroesophageal reflux disease)     hx of   Past Surgical History  Procedure Laterality Date  . Foot surgery  2003    tendon repair  . Leg tendon surgery  2003    left  . Eye surgery  2006    left eyelid surgery  . Posterior cervical fusion/foraminotomy  12/15/2012    Procedure: POSTERIOR CERVICAL FUSION/FORAMINOTOMY LEVEL 3;  Surgeon: Melina Schools, MD;  Location: [place];  Service: Orthopedics;  Laterality: N/A;  POSTERIOR CERVICAL FUSION CERVICAL FOUR TO THORACIC TWO  . Flexible sigmoidoscopy N/A 04/12/2014    Procedure: FLEXIBLE SIGMOIDOSCOPY;  Surgeon: Milus Banister, MD;  Location: [facility];  Service: Endoscopy;  Laterality: N/A;    reports that he has never smoked. He has never used smokeless tobacco. He reports that he drinks alcohol. He reports that he does not use illicit drugs. family history includes Arthritis in his father and mother; Cancer in his father; Hypertension in his mother; Kidney failure in his mother. There is no history of Colon cancer, Esophageal cancer, Pancreatic cancer, Rectal cancer, or Stomach cancer. No Known Allergies    Review of Systems  Constitutional: Negative for fever and chills.  Skin: Positive  for rash.       Objective:   Physical Exam  Constitutional: He appears well-developed and well-nourished.  Cardiovascular: Normal rate and regular rhythm.   Pulmonary/Chest: Effort normal and breath sounds normal. No respiratory distress. He has no wheezes. He has no rales.  Abdominal: Soft. There is tenderness.  History tenderness in the right flank region around the area of his rash  Skin: Rash noted.  He has a small cluster of vesicles with erythematous base right flank area in a dermatome distribution          Assessment & Plan:   Shingles right flank region. Patient currently on Valtrex. Still having severe pain at times. Not relieved with tramadol. Start gabapentin 300 mg daily at bedtime and slowly titrated up to 1 3 times a day. Reassess in 2 weeks and sooner as needed. Reviewed potential side effects such as sedation with gabapentin

## 2015-12-31 ENCOUNTER — Telehealth: Payer: Self-pay | Admitting: Family Medicine

## 2015-12-31 NOTE — Telephone Encounter (Signed)
Patient was in to see the doctor yesterday, 12/30/15 and he forgot to get an out of work letter.  He would like that letter forwarded to his email: [email or web address]

## 2016-01-01 NOTE — Telephone Encounter (Signed)
yes

## 2016-01-01 NOTE — Telephone Encounter (Signed)
Okay for work note for 1/16?

## 2016-01-02 NOTE — Telephone Encounter (Signed)
Work note has been faxed to pt. He is aware of this.

## 2017-05-11 ENCOUNTER — Ambulatory Visit (INDEPENDENT_AMBULATORY_CARE_PROVIDER_SITE_OTHER): Payer: 59 | Admitting: Family Medicine

## 2017-05-11 ENCOUNTER — Encounter: Payer: Self-pay | Admitting: Family Medicine

## 2017-05-11 VITALS — BP 142/84 | HR 58 | Temp 98.2°F | Wt 235.7 lb

## 2017-05-11 DIAGNOSIS — M25561 Pain in right knee: Secondary | ICD-10-CM

## 2017-05-11 MED ORDER — PREDNISONE 10 MG PO TABS: ORAL_TABLET | ORAL | 0 refills | Status: DC

## 2017-05-11 NOTE — Progress Notes (Signed)
Subjective:     Patient ID: Martin Costa, male   DOB: 10/17/1962, 56 y.o.   MRN: [id number]  HPI Patient seen with acute right knee pain. He states that Wednesday he walked for about 2 hours continuously on a treadmill and tolerated well with no discomfort. He woke up Thursday morning with some swelling of the right knee and pain mostly around the medial aspect of the kneecap and just underneath the kneecap region. He tried some heat, ice, and ibuprofen with nominal relief. No recent injury. No locking or giving way.  Patient does have history of gout and states this feels different than his previous flareups. He is no longer taking allopurinol. He has not noted any redness or bruising.  Past Medical History:  Diagnosis Date  . Arthritis   . Bronchitis    hx of  . GERD (gastroesophageal reflux disease)    hx of   Past Surgical History:  Procedure Laterality Date  . EYE SURGERY  2006   left eyelid surgery  . FLEXIBLE SIGMOIDOSCOPY N/A 04/12/2014   Procedure: FLEXIBLE SIGMOIDOSCOPY;  Surgeon: Milus Banister, MD;  Location: [facility];  Service: Endoscopy;  Laterality: N/A;  . FOOT SURGERY  2003   tendon repair  . LEG TENDON SURGERY  2003   left  . POSTERIOR CERVICAL FUSION/FORAMINOTOMY  12/15/2012   Procedure: POSTERIOR CERVICAL FUSION/FORAMINOTOMY LEVEL 3;  Surgeon: Melina Schools, MD;  Location: [place];  Service: Orthopedics;  Laterality: N/A;  POSTERIOR CERVICAL FUSION CERVICAL FOUR TO THORACIC TWO    reports that he has never smoked. He has never used smokeless tobacco. He reports that he drinks alcohol. He reports that he does not use drugs. family history includes Arthritis in his father and mother; Cancer in his father; Hypertension in his mother; Kidney failure in his mother. No Known Allergies   Review of Systems  Constitutional: Negative for chills and fever.  Musculoskeletal: Positive for arthralgias.       Objective:   Physical Exam  Constitutional: He appears  well-developed and well-nourished.  Cardiovascular: Normal rate and regular rhythm.   Musculoskeletal: Normal range of motion.  Right knee full range of motion with flexion and extension. No obvious effusion. No warmth or erythema. No ecchymosis. He has some mild tenderness to palpation on the medial aspect of the right patella. Minimal medial joint line tenderness. No lateral tenderness. Cruciate and collateral ligament testing is normal       Assessment:     Acute right knee pain. Doubt this represents acute gout.  He does not have obvious effusion at this time.      Plan:     -Trial of prednisone taper over the next week. -Avoid regular squatting or overuse -Touch base if not improving in 1-2 weeks  Eulas Post MD [facility]

## 2017-05-11 NOTE — Patient Instructions (Signed)
WE NOW OFFER   [facility] FAST TRACK!!!  SAME DAY Appointments for ACUTE CARE  Such as: Sprains, Injuries, cuts, abrasions, rashes, muscle pain, joint pain, back pain Colds, flu, sore throats, headache, allergies, cough, fever  Ear pain, sinus and eye infections Abdominal pain, nausea, vomiting, diarrhea, upset stomach Animal/insect bites  3 Easy Ways to Schedule: Walk-In Scheduling Call in scheduling Mychart Sign-up: [email or web address]         

## 2017-05-21 ENCOUNTER — Telehealth: Payer: Self-pay | Admitting: Family Medicine

## 2017-05-21 DIAGNOSIS — M25561 Pain in right knee: Secondary | ICD-10-CM

## 2017-05-21 NOTE — Telephone Encounter (Signed)
Patient is calling in stating his right knee pain is no better and is requesting an xray or an MRI.

## 2017-05-24 NOTE — Telephone Encounter (Signed)
Would start with plain x-ray of the knee- 2 view.

## 2017-05-24 NOTE — Telephone Encounter (Signed)
I left a message for the pt to return my call. 

## 2017-05-25 NOTE — Telephone Encounter (Signed)
Patient is aware and order placed

## 2017-05-26 ENCOUNTER — Ambulatory Visit: Payer: 59 | Admitting: Family Medicine

## 2017-05-26 ENCOUNTER — Ambulatory Visit (INDEPENDENT_AMBULATORY_CARE_PROVIDER_SITE_OTHER)
Admission: RE | Admit: 2017-05-26 | Discharge: 2017-05-26 | Disposition: A | Discharge status: Home / Self Care | Payer: 59 | Source: Ambulatory Visit | Attending: Family Medicine | Admitting: Family Medicine

## 2017-05-26 DIAGNOSIS — M25561 Pain in right knee: Secondary | ICD-10-CM | POA: Diagnosis not present

## 2017-07-08 ENCOUNTER — Other Ambulatory Visit: Payer: Self-pay | Admitting: Family Medicine

## 2017-07-08 ENCOUNTER — Encounter: Payer: Self-pay | Admitting: Family Medicine

## 2017-07-08 ENCOUNTER — Ambulatory Visit (INDEPENDENT_AMBULATORY_CARE_PROVIDER_SITE_OTHER): Payer: 59 | Admitting: Family Medicine

## 2017-07-08 VITALS — BP 108/74 | HR 60 | Temp 98.0°F | Ht 71.0 in | Wt 230.2 lb

## 2017-07-08 DIAGNOSIS — M1000 Idiopathic gout, unspecified site: Secondary | ICD-10-CM

## 2017-07-08 MED ORDER — PREDNISONE 10 MG PO TABS: ORAL_TABLET | ORAL | 0 refills | Status: DC

## 2017-07-08 MED ORDER — ALLOPURINOL 100 MG PO TABS: ORAL_TABLET | ORAL | 0 refills | Status: DC

## 2017-07-08 NOTE — Progress Notes (Signed)
HPI:  Acute visit for "gout": -R 1st MTP -for 4 days, tried ibuprofen and not helping -Symptoms include a red, swollen and painful joint -No fevers, trauma, wound, malaise -reports his PCP "always" treats this with prednisone  -staff reports he was upset that he was required to schedule an appointment for this -on allopurinol in the past -ran out and has not been taking recently, requests refill  ROS: See pertinent positives and negatives per HPI.  Past Medical History:  Diagnosis Date  . Arthritis   . Bronchitis    hx of  . GERD (gastroesophageal reflux disease)    hx of    Past Surgical History:  Procedure Laterality Date  . EYE SURGERY  2006   left eyelid surgery  . FLEXIBLE SIGMOIDOSCOPY N/A 04/12/2014   Procedure: FLEXIBLE SIGMOIDOSCOPY;  Surgeon: Milus Banister, MD;  Location: [facility];  Service: Endoscopy;  Laterality: N/A;  . FOOT SURGERY  2003   tendon repair  . LEG TENDON SURGERY  2003   left  . POSTERIOR CERVICAL FUSION/FORAMINOTOMY  12/15/2012   Procedure: POSTERIOR CERVICAL FUSION/FORAMINOTOMY LEVEL 3;  Surgeon: Melina Schools, MD;  Location: [place];  Service: Orthopedics;  Laterality: N/A;  POSTERIOR CERVICAL FUSION CERVICAL FOUR TO THORACIC TWO    Family History  Problem Relation Age of Onset  . Arthritis Mother   . Hypertension Mother   . Kidney failure Mother   . Arthritis Father   . Cancer Father        ?prostate cancer  . Colon cancer Neg Hx   . Esophageal cancer Neg Hx   . Pancreatic cancer Neg Hx   . Rectal cancer Neg Hx   . Stomach cancer Neg Hx     Social History   Social History  . Marital status: Single    Spouse name: N/A  . Number of children: N/A  . Years of education: N/A   Social History Main Topics  . Smoking status: Never Smoker  . Smokeless tobacco: Never Used  . Alcohol use Yes     Comment: "social"  . Drug use: No  . Sexual activity: Not Asked   Other Topics Concern  . None   Social History Narrative  . None      Current Outpatient Prescriptions:  .  acetaminophen (TYLENOL) 500 MG tablet, Take 500 mg by mouth every 6 (six) hours as needed., Disp: , Rfl:  .  allopurinol (ZYLOPRIM) 100 MG tablet, Take one daily for 2 weeks and then two daily for 2 weeks and then three daily., Disp: 90 tablet, Rfl: 0 .  gabapentin (NEURONTIN) 300 MG capsule, Take 1 capsule (300 mg total) by mouth 3 (three) times daily., Disp: 90 capsule, Rfl: 3 .  traMADol (ULTRAM) 50 MG tablet, Take 1 tablet (50 mg total) by mouth every 6 (six) hours as needed., Disp: 15 tablet, Rfl: 0 .  predniSONE (DELTASONE) 10 MG tablet, Taper as follows: 4-4-4-3-3-2-2, Disp: 22 tablet, Rfl: 0  EXAM:  Vitals:   07/08/17 1517  BP: 108/74  Pulse: 60  Temp: 98 F (36.7 C)    Body mass index is 32.11 kg/m.  GENERAL: vitals reviewed and listed above, alert, oriented, appears well hydrated and in no acute distress  HEENT: atraumatic, conjunttiva clear, no obvious abnormalities on inspection of external nose and ears  NECK: no obvious masses on inspection  LUNGS: clear to auscultation bilaterally, no wheezes, rales or rhonchi, good air movement  CV: HRRR, no peripheral edema  MS: moves  all extremities without noticeable abnormality, mildly swollen, mildly erythematous, mildly warm right MTP joint - tender  PSYCH: pleasant and cooperative, no obvious depression or anxiety  ASSESSMENT AND PLAN:  Discussed the following assessment and plan:  Idiopathic gout, unspecified chronicity, unspecified site  -we discussed possible serious and likely etiologies, workup and treatment, treatment risks and return precautions - gout most likely given his history -after this discussion, Martin Costa opted for treatment with prednisone for acute gout since NSAIDs not working, refill of his allopurinol provided - advised future refills come from PCP -follow up advised as needed -of course, we advised Martin Costa  to return or notify a doctor immediately if  symptoms worsen or persist or new concerns arise.   Patient Instructions  Take the prednisone as instructed for the acute gout.  Can restart the allopurinol for your gout once over the acute phase. Get further refills from your primary doctor.  I hope you are feeling better soon! Seek care immediately if worsening, new concerns or you are not improving with treatment.     Martin Costa R., DO

## 2017-07-08 NOTE — Patient Instructions (Signed)
Take the prednisone as instructed for the acute gout.  Can restart the allopurinol for your gout once over the acute phase. Get further refills from your primary doctor.  I hope you are feeling better soon! Seek care immediately if worsening, new concerns or you are not improving with treatment.

## 2017-08-11 ENCOUNTER — Ambulatory Visit (INDEPENDENT_AMBULATORY_CARE_PROVIDER_SITE_OTHER): Payer: 59 | Admitting: Family Medicine

## 2017-08-11 ENCOUNTER — Encounter: Payer: Self-pay | Admitting: Family Medicine

## 2017-08-11 VITALS — BP 110/80 | HR 77 | Temp 99.7°F | Ht 70.0 in | Wt 226.3 lb

## 2017-08-11 DIAGNOSIS — Z0000 Encounter for general adult medical examination without abnormal findings: Secondary | ICD-10-CM

## 2017-08-11 DIAGNOSIS — Z23 Encounter for immunization: Secondary | ICD-10-CM | POA: Diagnosis not present

## 2017-08-11 LAB — CBC WITH DIFFERENTIAL/PLATELET
BASOS PCT: 0.5 % (ref 0.0–3.0)
Basophils Absolute: 0 10*3/uL (ref 0.0–0.1)
EOS ABS: 0.1 10*3/uL (ref 0.0–0.7)
Eosinophils Relative: 1.6 % (ref 0.0–5.0)
HEMATOCRIT: 41.1 % (ref 39.0–52.0)
Hemoglobin: 14 g/dL (ref 13.0–17.0)
LYMPHS PCT: 38.5 % (ref 12.0–46.0)
Lymphs Abs: 2 10*3/uL (ref 0.7–4.0)
MCHC: 34 g/dL (ref 30.0–36.0)
MCV: 94.5 fl (ref 78.0–100.0)
Monocytes Absolute: 0.4 10*3/uL (ref 0.1–1.0)
Monocytes Relative: 8.2 % (ref 3.0–12.0)
NEUTROS ABS: 2.7 10*3/uL (ref 1.4–7.7)
Neutrophils Relative %: 51.2 % (ref 43.0–77.0)
PLATELETS: 351 10*3/uL (ref 150.0–400.0)
RBC: 4.35 Mil/uL (ref 4.22–5.81)
RDW: 13.9 % (ref 11.5–15.5)
WBC: 5.2 10*3/uL (ref 4.0–10.5)

## 2017-08-11 LAB — LIPID PANEL
CHOLESTEROL: 168 mg/dL (ref 0–200)
HDL: 54.4 mg/dL (ref >39.00)
LDL CALC: 96 mg/dL (ref 0–99)
NonHDL: 113.16
TRIGLYCERIDES: 88 mg/dL (ref 0.0–149.0)
Total CHOL/HDL Ratio: 3
VLDL: 17.6 mg/dL (ref 0.0–40.0)

## 2017-08-11 LAB — HEPATIC FUNCTION PANEL
ALT: 11 U/L (ref 0–53)
AST: 13 U/L (ref 0–37)
Albumin: 4.3 g/dL (ref 3.5–5.2)
Alkaline Phosphatase: 167 U/L — ABNORMAL HIGH (ref 39–117)
BILIRUBIN TOTAL: 0.5 mg/dL (ref 0.2–1.2)
Bilirubin, Direct: 0.1 mg/dL (ref 0.0–0.3)
TOTAL PROTEIN: 6.8 g/dL (ref 6.0–8.3)

## 2017-08-11 LAB — BASIC METABOLIC PANEL
BUN: 12 mg/dL (ref 6–23)
CHLORIDE: 108 meq/L (ref 96–112)
CO2: 30 mEq/L (ref 19–32)
CREATININE: 1.13 mg/dL (ref 0.40–1.50)
Calcium: 9.4 mg/dL (ref 8.4–10.5)
GFR: 86.7 mL/min (ref >60.00)
Glucose, Bld: 104 mg/dL — ABNORMAL HIGH (ref 70–99)
POTASSIUM: 4.3 meq/L (ref 3.5–5.1)
SODIUM: 143 meq/L (ref 135–145)

## 2017-08-11 LAB — TSH: TSH: 1.46 u[IU]/mL (ref 0.35–4.50)

## 2017-08-11 LAB — URIC ACID: URIC ACID, SERUM: 6.3 mg/dL (ref 4.0–7.8)

## 2017-08-11 LAB — PSA: PSA: 1.02 ng/mL (ref 0.10–4.00)

## 2017-08-11 NOTE — Progress Notes (Signed)
Subjective:     Patient ID: Martin Costa, male   DOB: 09-28-1961, 56 y.o.   MRN: [id number]  HPI Patient here for physical exam. He has history of gout and takes allopurinol. He has had one flareup in the past year but generally well-controlled. He has had some recent issues with vertigo to the left side somewhat intermittent for the past few weeks. No hearing loss. No ataxia. No focal weakness. No headaches.  No history of hepatitis C screening. Low risk overall. Tetanus over 10 years ago. Declines flu vaccine. Colonoscopy up-to-date. Nonsmoker.  Past Medical History:  Diagnosis Date  . Arthritis   . Bronchitis    hx of  . GERD (gastroesophageal reflux disease)    hx of   Past Surgical History:  Procedure Laterality Date  . EYE SURGERY  2006   left eyelid surgery  . FLEXIBLE SIGMOIDOSCOPY N/A 04/12/2014   Procedure: FLEXIBLE SIGMOIDOSCOPY;  Surgeon: Milus Banister, MD;  Location: [facility];  Service: Endoscopy;  Laterality: N/A;  . FOOT SURGERY  2003   tendon repair  . LEG TENDON SURGERY  2003   left  . POSTERIOR CERVICAL FUSION/FORAMINOTOMY  12/15/2012   Procedure: POSTERIOR CERVICAL FUSION/FORAMINOTOMY LEVEL 3;  Surgeon: Melina Schools, MD;  Location: [place];  Service: Orthopedics;  Laterality: N/A;  POSTERIOR CERVICAL FUSION CERVICAL FOUR TO THORACIC TWO    reports that he has never smoked. He has never used smokeless tobacco. He reports that he drinks alcohol. He reports that he does not use drugs. family history includes Arthritis in his father and mother; Cancer in his father; Hypertension in his mother; Kidney failure in his mother. No Known Allergies   Review of Systems  Constitutional: Negative for activity change, appetite change, fatigue and fever.  HENT: Negative for congestion, ear pain and trouble swallowing.   Eyes: Negative for pain and visual disturbance.  Respiratory: Negative for cough, shortness of breath and wheezing.   Cardiovascular: Negative for chest pain  and palpitations.  Gastrointestinal: Negative for abdominal distention, abdominal pain, blood in stool, constipation, diarrhea, nausea, rectal pain and vomiting.  Genitourinary: Negative for dysuria, hematuria and testicular pain.  Musculoskeletal: Negative for arthralgias and joint swelling.  Skin: Negative for rash.  Neurological: Positive for dizziness. Negative for tremors, seizures, syncope, speech difficulty, weakness and headaches.  Hematological: Negative for adenopathy.  Psychiatric/Behavioral: Negative for confusion and dysphoric mood.       Objective:   Physical Exam  Constitutional: He is oriented to person, place, and time. He appears well-developed and well-nourished. No distress.  HENT:  Head: Normocephalic and atraumatic.  Right Ear: External ear normal.  Left Ear: External ear normal.  Mouth/Throat: Oropharynx is clear and moist.  Eyes: Pupils are equal, round, and reactive to light. Conjunctivae and EOM are normal.  Neck: Normal range of motion. Neck supple. No thyromegaly present.  Cardiovascular: Normal rate, regular rhythm and normal heart sounds.   No murmur heard. Pulmonary/Chest: No respiratory distress. He has no wheezes. He has no rales.  Abdominal: Soft. Bowel sounds are normal. He exhibits no distension and no mass. There is no tenderness. There is no rebound and no guarding.  Musculoskeletal: He exhibits no edema.  Lymphadenopathy:    He has no cervical adenopathy.  Neurological: He is alert and oriented to person, place, and time. He displays normal reflexes. No cranial nerve deficit.  Positive Dix Hallpike maneuver to the left. He has some horizontal nystagmus  Skin: No rash noted.  Psychiatric: He has  a normal mood and affect.       Assessment:     Physical exam. Patient has evidence for benign peripheral positional vertigo to the left. History of gout generally well-controlled    Plan:     -Check lab work and include uric acid along with HIV  and hepatitis C screening. We discussed risk and benefits of screening for elevated PSA and patient wishes to proceed with that -Check on coverage for new shingles vaccine and let us know if interested -Patient declines flu vaccine -Tetanus booster given  Eulas Post MD [facility] at [facility]

## 2017-08-11 NOTE — Patient Instructions (Signed)

## 2017-08-12 LAB — HIV ANTIBODY (ROUTINE TESTING W REFLEX): HIV: NONREACTIVE

## 2017-08-12 LAB — HEPATITIS C ANTIBODY: HCV AB: NONREACTIVE

## 2017-08-23 ENCOUNTER — Telehealth: Payer: Self-pay | Admitting: Family Medicine

## 2017-08-23 MED ORDER — PREDNISONE 20 MG PO TABS: ORAL_TABLET | ORAL | 0 refills | Status: DC

## 2017-08-23 NOTE — Telephone Encounter (Signed)
Pt need new Rx for gout  Pharm:  [facility] on [address]

## 2017-08-23 NOTE — Telephone Encounter (Signed)
Pt needs prednisone

## 2017-08-23 NOTE — Telephone Encounter (Signed)
May refill prednisone 20 mg two po qd for 5 days (disp #10)

## 2017-08-23 NOTE — Telephone Encounter (Signed)
Rx sent 

## 2017-08-24 ENCOUNTER — Other Ambulatory Visit: Payer: Self-pay | Admitting: *Deleted

## 2017-08-24 MED ORDER — ALLOPURINOL 100 MG PO TABS: ORAL_TABLET | ORAL | 0 refills | Status: DC

## 2017-09-02 ENCOUNTER — Telehealth: Payer: Self-pay | Admitting: Pediatrics

## 2017-09-02 ENCOUNTER — Encounter: Payer: Self-pay | Admitting: Family Medicine

## 2017-09-02 NOTE — Telephone Encounter (Signed)
Pharmacy faxed request for Pred-Pak.  Rx for pred recently given 08/23/17 by Dr Elease Hashimoto, please advise

## 2017-09-02 NOTE — Telephone Encounter (Signed)
Would have PCP review tomorrow.

## 2017-09-02 NOTE — Telephone Encounter (Signed)
Forwarding to PCP.

## 2017-09-03 MED ORDER — PREDNISONE 10 MG (21) PO TBPK: ORAL_TABLET | ORAL | 0 refills | Status: DC

## 2017-09-03 NOTE — Telephone Encounter (Signed)
May refill once.  He has taken this for gout flares.

## 2017-09-03 NOTE — Telephone Encounter (Signed)
Done

## 2017-09-22 ENCOUNTER — Other Ambulatory Visit: Payer: Self-pay | Admitting: Family Medicine

## 2017-09-24 NOTE — Telephone Encounter (Signed)
Rx done. 

## 2017-09-24 NOTE — Telephone Encounter (Signed)
Refill once 

## 2017-09-29 ENCOUNTER — Telehealth: Payer: Self-pay | Admitting: Family Medicine

## 2017-09-29 NOTE — Telephone Encounter (Signed)
Pt states he has reoccurring gout attacks from right wrist to right foot big toe area. Pt states it seems to move back and forth. Right now in the foot.  Prednisone is not working.   Pt wants to know if there is an injection he can get for the swelling/inflammation. Pt states pain so bad he can hardly walk.  Pt would like to switch to the  Colchicine 06 MG caps  [facility] [place] [address]

## 2017-09-29 NOTE — Telephone Encounter (Signed)
Appointment made

## 2017-09-29 NOTE — Telephone Encounter (Signed)
Recommend follow up

## 2017-09-30 NOTE — Telephone Encounter (Signed)
Called pt to reschedule appt for Friday am.  Pt states he does not know why he needs to come in anyway. Pt states he used to be on  Colchicine and it was decided he try the prednisone. Pt states since the prednisone is not working, all he wants to do is try the colchicine again. If that does not work, pt states he has no problem coming in.

## 2017-10-01 ENCOUNTER — Ambulatory Visit: Payer: Self-pay | Admitting: Family Medicine

## 2017-10-01 MED ORDER — COLCHICINE 0.6 MG PO TABS: ORAL_TABLET | ORAL | 2 refills | Status: DC

## 2017-10-01 NOTE — Addendum Note (Signed)
Addended by: Westley Hummer B on: 10/01/2017 10:21 AM   Modules accepted: Orders

## 2017-10-01 NOTE — Telephone Encounter (Signed)
Refill colchicine 0.6 mg take 2 at onset of gout flare and then 1 every 12 hours dispense #30 with 2 refills. We need to see him back if he keeps having frequent flareups so that we can get his gout under better control.

## 2017-10-01 NOTE — Telephone Encounter (Signed)
Rx sent 

## 2017-10-01 NOTE — Telephone Encounter (Signed)
Pt is calling very upset that no one returned his call and he was made aware that Dr. Elease Hashimoto was not in the office he is just wanting a medication change and feel that he does not need to come in for that he just need something to get rid of the gout he has.

## 2017-10-01 NOTE — Telephone Encounter (Signed)
Rx was sent this morning and the patient is aware

## 2018-12-20 ENCOUNTER — Encounter: Payer: Self-pay | Admitting: Family Medicine

## 2018-12-20 ENCOUNTER — Ambulatory Visit (INDEPENDENT_AMBULATORY_CARE_PROVIDER_SITE_OTHER): Payer: 59 | Admitting: Family Medicine

## 2018-12-20 ENCOUNTER — Other Ambulatory Visit: Payer: Self-pay

## 2018-12-20 VITALS — BP 118/80 | HR 66 | Temp 98.2°F | Ht 67.5 in | Wt 228.1 lb

## 2018-12-20 DIAGNOSIS — R748 Abnormal levels of other serum enzymes: Secondary | ICD-10-CM | POA: Diagnosis not present

## 2018-12-20 DIAGNOSIS — Z0000 Encounter for general adult medical examination without abnormal findings: Secondary | ICD-10-CM | POA: Diagnosis not present

## 2018-12-20 NOTE — Progress Notes (Addendum)
Subjective:     Patient ID: Martin Costa, male   DOB: 06/10/1961, 58 y.o.   MRN: [id number]  HPI Patient seen for physical.  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  Recommended 10-year follow-up.  Has had no recent change in stool habits.  He has history of gout.  No recent flareups.  Patient has chronic history of mild elevated alkaline phosphatase.  He went for recent life insurance physical and had several labs done.  Most of these were normal.  He had A1c 5.1%.  Renal function normal.  Hepatic function normal.  Cholesterol 175, HDL 57, LDL 85.  PSA 1.14.  Alkaline phosphatase came back 291 but he was not fasting.  He has not had any recent abdominal pain.  Patient is non-smoker.  Tetanus is up-to-date.  Past Medical History:  Diagnosis Date  . Arthritis   . Bronchitis    hx of  . GERD (gastroesophageal reflux disease)    hx of   Past Surgical History:  Procedure Laterality Date  . EYE SURGERY  2006   left eyelid surgery  . FLEXIBLE SIGMOIDOSCOPY N/A 04/12/2014   Procedure: FLEXIBLE SIGMOIDOSCOPY;  Surgeon: Milus Banister, MD;  Location: [facility];  Service: Endoscopy;  Laterality: N/A;  . FOOT SURGERY  2003   tendon repair  . LEG TENDON SURGERY  2003   left  . POSTERIOR CERVICAL FUSION/FORAMINOTOMY  12/15/2012   Procedure: POSTERIOR CERVICAL FUSION/FORAMINOTOMY LEVEL 3;  Surgeon: Melina Schools, MD;  Location: [place];  Service: Orthopedics;  Laterality: N/A;  POSTERIOR CERVICAL FUSION CERVICAL FOUR TO THORACIC TWO    reports that he has never smoked. He has never used smokeless tobacco. He reports current alcohol use. He reports that he does not use drugs. family history includes Arthritis in his father and mother; Cancer in his father; Hypertension in his mother; Kidney failure in his mother. No Known Allergies   Review of Systems  Constitutional: Negative for activity change, appetite change, fatigue and fever.  HENT: Negative for  congestion, ear pain and trouble swallowing.   Eyes: Negative for pain and visual disturbance.  Respiratory: Negative for cough, shortness of breath and wheezing.   Cardiovascular: Negative for chest pain and palpitations.  Gastrointestinal: Negative for abdominal distention, abdominal pain, blood in stool, constipation, diarrhea, nausea, rectal pain and vomiting.  Genitourinary: Negative for dysuria, hematuria and testicular pain.  Musculoskeletal: Negative for arthralgias and joint swelling.  Skin: Negative for rash.  Neurological: Negative for dizziness, syncope and headaches.  Hematological: Negative for adenopathy.  Psychiatric/Behavioral: Negative for confusion and dysphoric mood.       Objective:   Physical Exam Constitutional:      General: He is not in acute distress.    Appearance: He is well-developed.  HENT:     Head: Normocephalic and atraumatic.     Right Ear: External ear normal.     Left Ear: External ear normal.  Eyes:     Conjunctiva/sclera: Conjunctivae normal.     Pupils: Pupils are equal, round, and reactive to light.  Neck:     Musculoskeletal: Normal range of motion and neck supple.     Thyroid: No thyromegaly.  Cardiovascular:     Rate and Rhythm: Normal rate and regular rhythm.     Heart sounds: Normal heart sounds. No murmur.  Pulmonary:     Effort: No respiratory distress.     Breath sounds: No wheezing or rales.  Abdominal:  General: Bowel sounds are normal. There is no distension.     Palpations: Abdomen is soft. There is no mass.     Tenderness: There is no abdominal tenderness. There is no guarding or rebound.  Lymphadenopathy:     Cervical: No cervical adenopathy.  Skin:    Findings: No rash.  Neurological:     Mental Status: He is alert and oriented to person, place, and time.     Cranial Nerves: No cranial nerve deficit.     Deep Tendon Reflexes: Reflexes normal.        Assessment:     Physical exam.  Patient had recent  elevated alkaline phosphatase level of 291.  This was nonfasting.  He appears to have some chronic mild elevation of alkaline phosphatase.      Plan:     -Return for fasting labs including hepatic panel, 5 prime nucleotidase, and basic metabolic panel. -We reviewed his recent labs and did not see need to get any other additional labs other than the ones above -Offered flu vaccine and he declines  Eulas Post MD [facility] at [facility]  Has normal follow-up from nucleotidase level.  We reviewed results with patient.  He will return for 25 hydroxy vitamin D level.  If low will replace  Eulas Post MD [facility] at [facility]  -

## 2018-12-23 ENCOUNTER — Other Ambulatory Visit (INDEPENDENT_AMBULATORY_CARE_PROVIDER_SITE_OTHER): Payer: 59

## 2018-12-23 DIAGNOSIS — R748 Abnormal levels of other serum enzymes: Secondary | ICD-10-CM

## 2018-12-23 LAB — HEPATIC FUNCTION PANEL
ALT: 14 U/L (ref 0–53)
AST: 15 U/L (ref 0–37)
Albumin: 4.2 g/dL (ref 3.5–5.2)
Alkaline Phosphatase: 243 U/L — ABNORMAL HIGH (ref 39–117)
BILIRUBIN DIRECT: 0.1 mg/dL (ref 0.0–0.3)
Total Bilirubin: 0.6 mg/dL (ref 0.2–1.2)
Total Protein: 6.4 g/dL (ref 6.0–8.3)

## 2018-12-23 LAB — BASIC METABOLIC PANEL
BUN: 15 mg/dL (ref 6–23)
CALCIUM: 9.2 mg/dL (ref 8.4–10.5)
CO2: 29 mEq/L (ref 19–32)
CREATININE: 1.18 mg/dL (ref 0.40–1.50)
Chloride: 106 mEq/L (ref 96–112)
GFR: 82.07 mL/min (ref >60.00)
Glucose, Bld: 69 mg/dL — ABNORMAL LOW (ref 70–99)
Potassium: 3.8 mEq/L (ref 3.5–5.1)
Sodium: 141 mEq/L (ref 135–145)

## 2018-12-25 LAB — NUCLEOTIDASE, 5', BLOOD: 5-Nucleotidase: 3 U/L (ref 0–10)

## 2018-12-26 NOTE — Addendum Note (Signed)
Addended by: Eulas Post on: 12/26/2018 05:51 PM   Modules accepted: Orders

## 2019-02-03 IMAGING — DX DG KNEE 1-2V*R*
2 series · 2 of 2 positions shown · non-contrast
Comparison: None.

CLINICAL DATA: Anterior pain for 3 weeks

EXAM:
RIGHT KNEE - 1-2 VIEW

[knee ap]
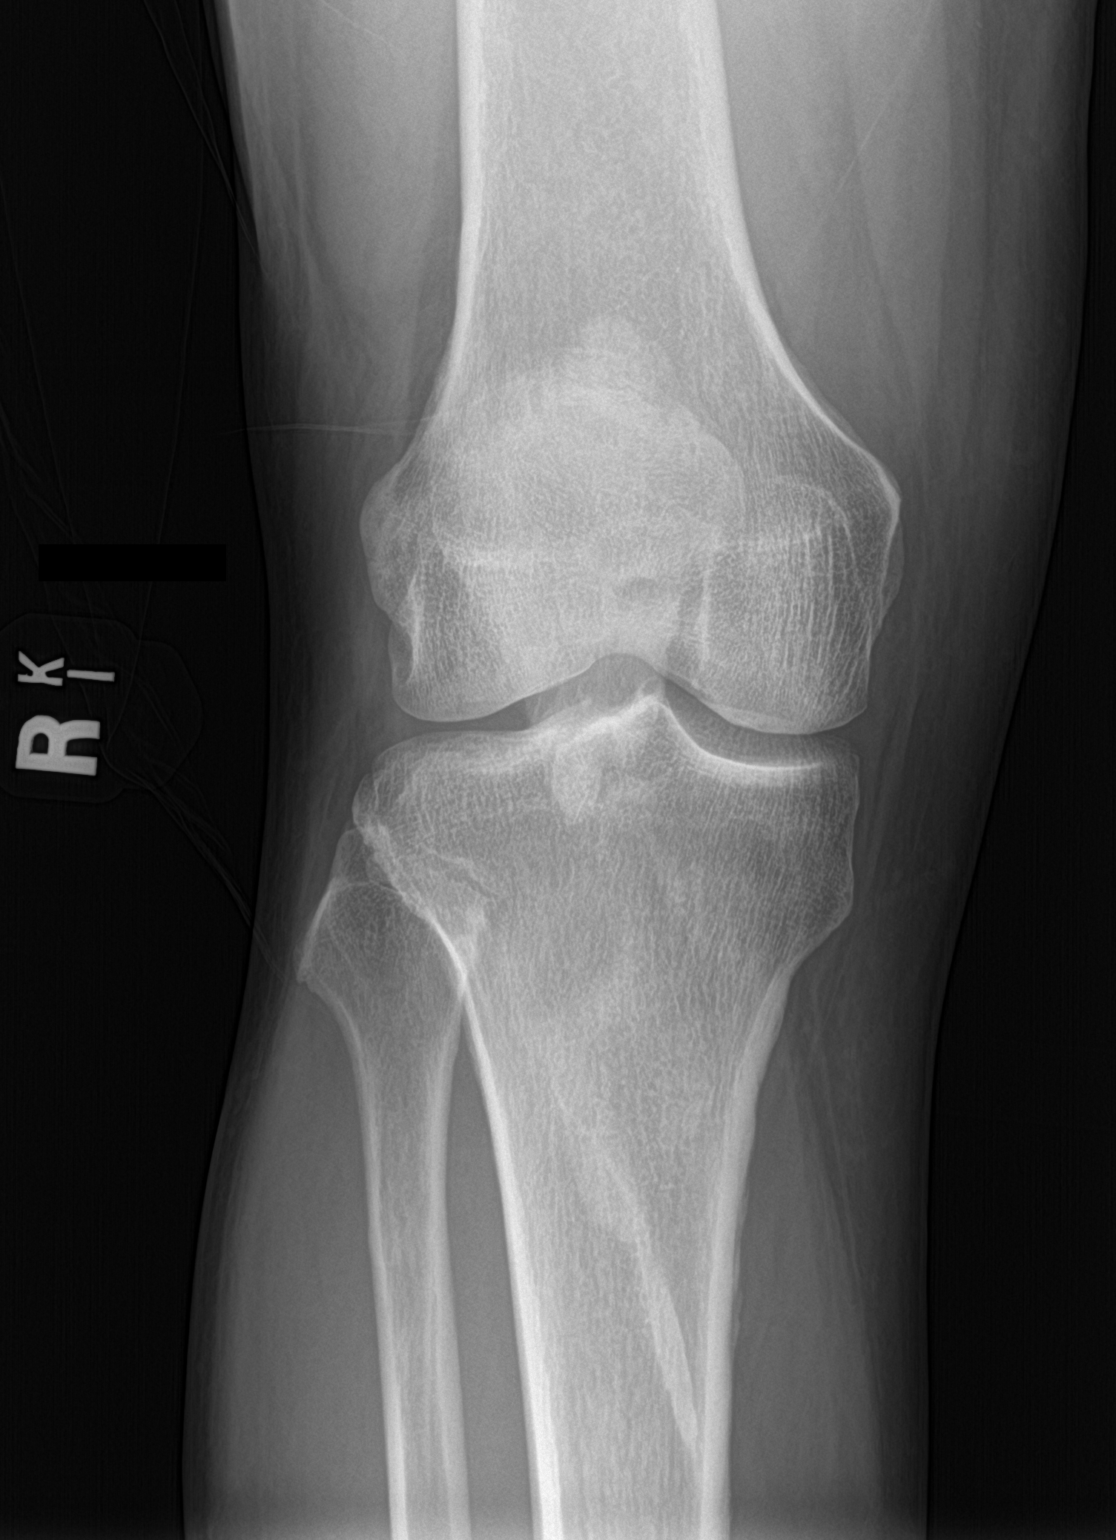

[knee lat]
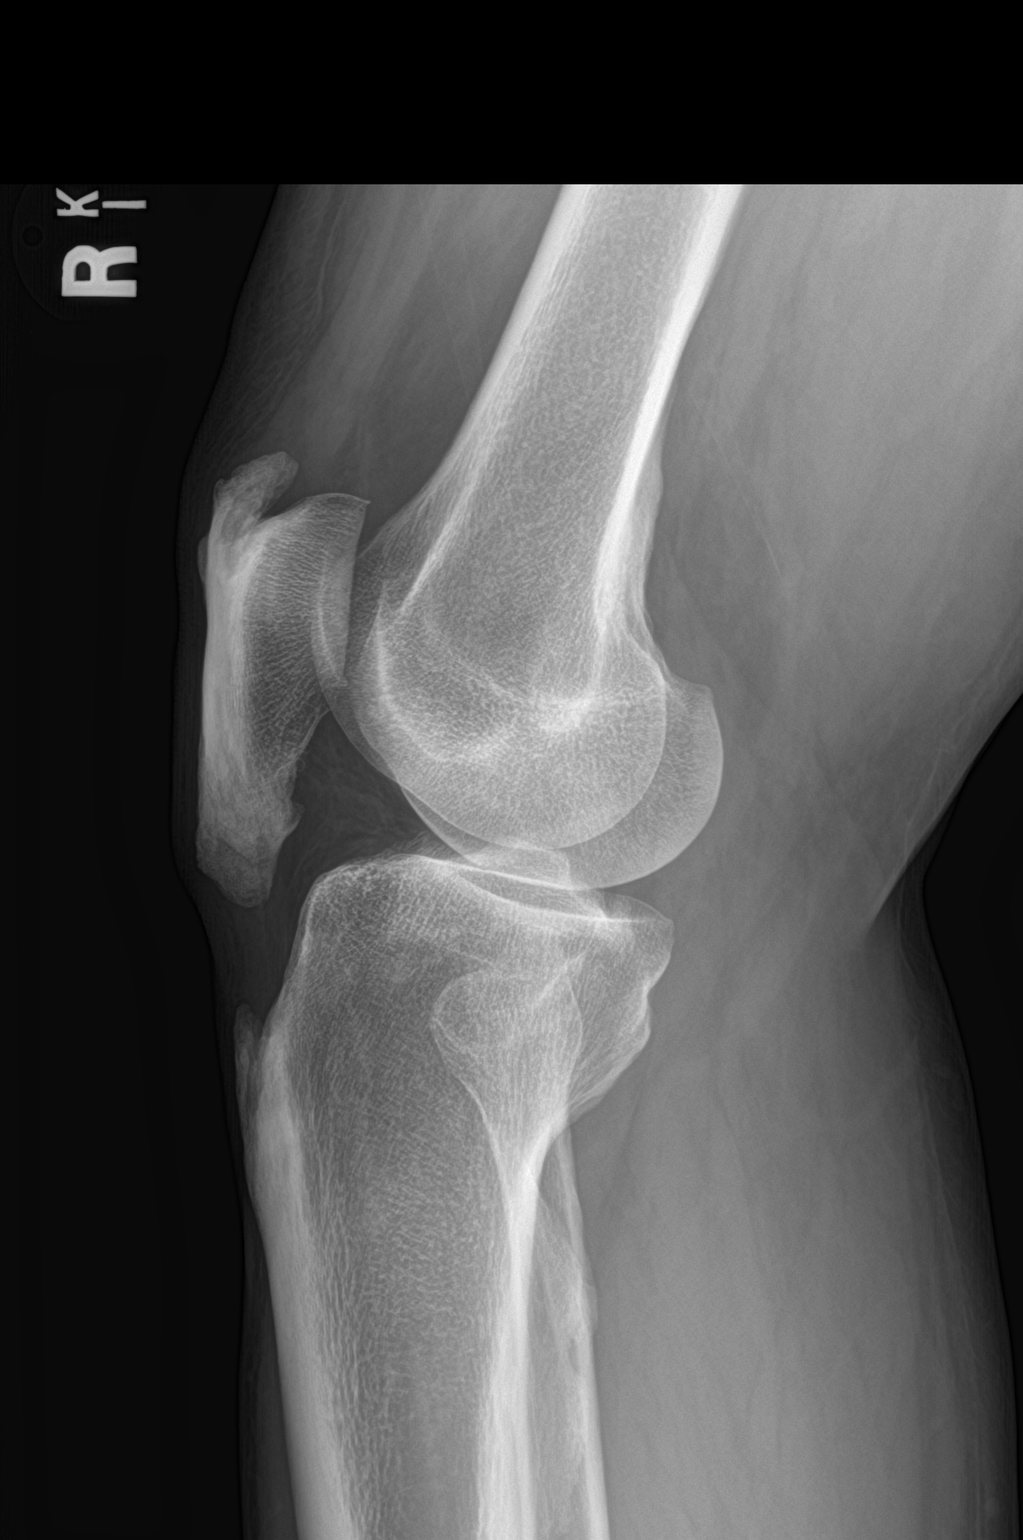

[2 of 2 positions shown; findings below may reference images not displayed]

FINDINGS: No fracture or malalignment. Mild patellofemoral degenerative
changes with spurring. Mild degenerative changes of the lateral
compartment. Prominent superior and inferior patellar enthesophytes.
No large joint effusion.
IMPRESSION: Mild degenerative changes.  No acute osseous abnormality.

## 2019-02-22 ENCOUNTER — Other Ambulatory Visit: Payer: Self-pay

## 2019-02-22 ENCOUNTER — Ambulatory Visit (INDEPENDENT_AMBULATORY_CARE_PROVIDER_SITE_OTHER): Payer: 59 | Admitting: Family Medicine

## 2019-02-22 ENCOUNTER — Encounter: Payer: Self-pay | Admitting: Family Medicine

## 2019-02-22 VITALS — BP 126/78 | HR 68 | Temp 98.5°F | Ht 67.5 in | Wt 235.9 lb

## 2019-02-22 DIAGNOSIS — R748 Abnormal levels of other serum enzymes: Secondary | ICD-10-CM

## 2019-02-22 DIAGNOSIS — R531 Weakness: Secondary | ICD-10-CM

## 2019-02-22 DIAGNOSIS — R197 Diarrhea, unspecified: Secondary | ICD-10-CM

## 2019-02-22 LAB — COMPREHENSIVE METABOLIC PANEL
ALBUMIN: 4.5 g/dL (ref 3.5–5.2)
ALT: 14 U/L (ref 0–53)
AST: 16 U/L (ref 0–37)
Alkaline Phosphatase: 259 U/L — ABNORMAL HIGH (ref 39–117)
BUN: 19 mg/dL (ref 6–23)
CHLORIDE: 102 meq/L (ref 96–112)
CO2: 31 mEq/L (ref 19–32)
Calcium: 9.5 mg/dL (ref 8.4–10.5)
Creatinine, Ser: 1.16 mg/dL (ref 0.40–1.50)
GFR: 78.71 mL/min (ref >60.00)
Glucose, Bld: 86 mg/dL (ref 70–99)
Potassium: 4.8 mEq/L (ref 3.5–5.1)
Sodium: 139 mEq/L (ref 135–145)
Total Bilirubin: 0.5 mg/dL (ref 0.2–1.2)
Total Protein: 7 g/dL (ref 6.0–8.3)

## 2019-02-22 LAB — CBC WITH DIFFERENTIAL/PLATELET
Basophils Absolute: 0 10*3/uL (ref 0.0–0.1)
Basophils Relative: 0.4 % (ref 0.0–3.0)
Eosinophils Absolute: 0.1 10*3/uL (ref 0.0–0.7)
Eosinophils Relative: 2 % (ref 0.0–5.0)
HCT: 43.3 % (ref 39.0–52.0)
HEMOGLOBIN: 14.7 g/dL (ref 13.0–17.0)
Lymphocytes Relative: 35 % (ref 12.0–46.0)
Lymphs Abs: 2.5 10*3/uL (ref 0.7–4.0)
MCHC: 34 g/dL (ref 30.0–36.0)
MCV: 93.9 fl (ref 78.0–100.0)
Monocytes Absolute: 0.7 10*3/uL (ref 0.1–1.0)
Monocytes Relative: 9.3 % (ref 3.0–12.0)
Neutro Abs: 3.8 10*3/uL (ref 1.4–7.7)
Neutrophils Relative %: 53.3 % (ref 43.0–77.0)
Platelets: 307 10*3/uL (ref 150.0–400.0)
RBC: 4.61 Mil/uL (ref 4.22–5.81)
RDW: 13.6 % (ref 11.5–15.5)
WBC: 7.1 10*3/uL (ref 4.0–10.5)

## 2019-02-22 LAB — VITAMIN D 25 HYDROXY (VIT D DEFICIENCY, FRACTURES): VITD: 18.83 ng/mL — ABNORMAL LOW (ref 30.00–100.00)

## 2019-02-22 NOTE — Patient Instructions (Signed)
Food Choices to Help Relieve Diarrhea, Adult When you have diarrhea, the foods you eat and your eating habits are very important. Choosing the right foods and drinks can help:  Relieve diarrhea.  Replace lost fluids and nutrients.  Prevent dehydration. What general guidelines should I follow?  Relieving diarrhea  Choose foods with less than 2 g or .07 oz. of fiber per serving.  Limit fats to less than 8 tsp (38 g or 1.34 oz.) a day.  Avoid the following: ? Foods and beverages sweetened with high-fructose corn syrup, honey, or sugar alcohols such as xylitol, sorbitol, and mannitol. ? Foods that contain a lot of fat or sugar. ? Fried, greasy, or spicy foods. ? High-fiber grains, breads, and cereals. ? Raw fruits and vegetables.  Eat foods that are rich in probiotics. These foods include dairy products such as yogurt and fermented milk products. They help increase healthy bacteria in the stomach and intestines (gastrointestinal tract, or GI tract).  If you have lactose intolerance, avoid dairy products. These may make your diarrhea worse.  Take medicine to help stop diarrhea (antidiarrheal medicine) only as told by your health care provider. Replacing nutrients  Eat small meals or snacks every 3-4 hours.  Eat bland foods, such as white rice, toast, or baked potato, until your diarrhea starts to get better. Gradually reintroduce nutrient-rich foods as tolerated or as told by your health care provider. This includes: ? Well-cooked protein foods. ? Peeled, seeded, and soft-cooked fruits and vegetables. ? Low-fat dairy products.  Take vitamin and mineral supplements as told by your health care provider. Preventing dehydration  Start by sipping water or a special solution to prevent dehydration (oral rehydration solution, ORS). Urine that is clear or pale yellow means that you are getting enough fluid.  Try to drink at least 8-10 cups of fluid each day to help replace lost fluids.   You may add other liquids in addition to water, such as clear juice or decaffeinated sports drinks, as tolerated or as told by your health care provider.  Avoid drinks with caffeine, such as coffee, tea, or soft drinks.  Avoid alcohol. What foods are recommended?     The items listed may not be a complete list. Talk with your health care provider about what dietary choices are best for you. Grains White rice. White, [place], or pita breads (fresh or toasted), including plain rolls, buns, or bagels. White pasta. Saltine, soda, or graham crackers. Pretzels. Low-fiber cereal. Cooked cereals made with water (such as cornmeal, farina, or cream cereals). Plain muffins. Matzo. Melba toast. Zwieback. Vegetables Potatoes (without the skin). Most well-cooked and canned vegetables without skins or seeds. Tender lettuce. Fruits Apple sauce. Fruits canned in juice. Cooked apricots, cherries, grapefruit, peaches, pears, or plums. Fresh bananas and cantaloupe. Meats and other protein foods Baked or boiled chicken. Eggs. Tofu. Fish. Seafood. Smooth nut butters. Ground or well-cooked tender beef, ham, veal, lamb, pork, or poultry. Dairy Plain yogurt, kefir, and unsweetened liquid yogurt. Lactose-free milk, buttermilk, skim milk, or soy milk. Low-fat or nonfat hard cheese. Beverages Water. Low-calorie sports drinks. Fruit juices without pulp. Strained tomato and vegetable juices. Decaffeinated teas. Sugar-free beverages not sweetened with sugar alcohols. Oral rehydration solutions, if approved by your health care provider. Seasoning and other foods Bouillon, broth, or soups made from recommended foods. What foods are not recommended? The items listed may not be a complete list. Talk with your health care provider about what dietary choices are best for you. Grains Whole  grain, whole wheat, bran, or rye breads, rolls, pastas, and crackers. Wild or brown rice. Whole grain or bran cereals. Barley. Oats and  oatmeal. Corn tortillas or taco shells. Granola. Popcorn. Vegetables Raw vegetables. Fried vegetables. Cabbage, broccoli, Brussels sprouts, artichokes, baked beans, beet greens, corn, kale, legumes, peas, sweet potatoes, and yams. Potato skins. Cooked spinach and cabbage. Fruits Dried fruit, including raisins and dates. Raw fruits. Stewed or dried prunes. Canned fruits with syrup. Meat and other protein foods Fried or fatty meats. Deli meats. Chunky nut butters. Nuts and seeds. Beans and lentils. Berniece Salines. Hot dogs. Sausage. Dairy High-fat cheeses. Whole milk, chocolate milk, and beverages made with milk, such as milk shakes. Half-and-half. Cream. sour cream. Ice cream. Beverages Caffeinated beverages (such as coffee, tea, soda, or energy drinks). Alcoholic beverages. Fruit juices with pulp. Prune juice. Soft drinks sweetened with high-fructose corn syrup or sugar alcohols. High-calorie sports drinks. Fats and oils Butter. Cream sauces. Margarine. Salad oils. Plain salad dressings. Olives. Avocados. Mayonnaise. Sweets and desserts Sweet rolls, doughnuts, and sweet breads. Sugar-free desserts sweetened with sugar alcohols such as xylitol and sorbitol. Seasoning and other foods Honey. Hot sauce. Chili powder. Gravy. Cream-based or milk-based soups. Pancakes and waffles. Summary  When you have diarrhea, the foods you eat and your eating habits are very important.  Make sure you get at least 8-10 cups of fluid each day, or enough to keep your urine clear or pale yellow.  Eat bland foods and gradually reintroduce healthy, nutrient-rich foods as tolerated, or as told by your health care provider.  Avoid high-fiber, fried, greasy, or spicy foods. This information is not intended to replace advice given to you by your health care provider. Make sure you discuss any questions you have with your health care provider. Document Released: 02/20/2004 Document Revised: 11/27/2016 Document Reviewed:  11/27/2016 Elsevier Interactive Patient Education  [employer].   May want to consider some Imodium as needed.

## 2019-02-22 NOTE — Progress Notes (Signed)
Subjective:     Patient ID: Martin Costa, male   DOB: 13-Jun-1961, 58 y.o.   MRN: [id number]  HPI Patient is seen with some mild nausea, diarrhea, fatigue, chills without fever.  He states that he had onset of symptoms about 9 days ago on Tuesday a week ago with current symptoms.  By Friday felt much better but then over the weekend he had some extreme exhaustion.  He has only rare dry cough.  He has about 4 nonbloody diarrhea stools per day.  No recent antibiotics.  No recent travels.  He goes between chilled and feeling hot but has not had any documented fever.  He has some increased fatigue.  Has not tried any Imodium.  Still feels queasy in the stomach but has not had any vomiting in a few days.  No abdominal pain.  Surprisingly, weight is up almost 7 pounds from last visit.  Poor appetite and decreased intake past week.  Past Medical History:  Diagnosis Date  . Arthritis   . Bronchitis    hx of  . GERD (gastroesophageal reflux disease)    hx of   Past Surgical History:  Procedure Laterality Date  . EYE SURGERY  2006   left eyelid surgery  . FLEXIBLE SIGMOIDOSCOPY N/A 04/12/2014   Procedure: FLEXIBLE SIGMOIDOSCOPY;  Surgeon: Milus Banister, MD;  Location: [facility];  Service: Endoscopy;  Laterality: N/A;  . FOOT SURGERY  2003   tendon repair  . LEG TENDON SURGERY  2003   left  . POSTERIOR CERVICAL FUSION/FORAMINOTOMY  12/15/2012   Procedure: POSTERIOR CERVICAL FUSION/FORAMINOTOMY LEVEL 3;  Surgeon: Melina Schools, MD;  Location: [place];  Service: Orthopedics;  Laterality: N/A;  POSTERIOR CERVICAL FUSION CERVICAL FOUR TO THORACIC TWO    reports that he has never smoked. He has never used smokeless tobacco. He reports current alcohol use. He reports that he does not use drugs. family history includes Arthritis in his father and mother; Cancer in his father; Hypertension in his mother; Kidney failure in his mother. No Known Allergies   Review of Systems  Constitutional: Positive for  appetite change, chills and fatigue. Negative for fever.  Respiratory: Positive for cough. Negative for shortness of breath and wheezing.   Cardiovascular: Negative for chest pain.  Gastrointestinal: Positive for diarrhea and nausea. Negative for abdominal pain, blood in stool and vomiting.  Genitourinary: Negative for dysuria.  Neurological: Positive for weakness. Negative for dizziness.       Objective:   Physical Exam Constitutional:      Appearance: Normal appearance.  HENT:     Mouth/Throat:     Mouth: Mucous membranes are moist.     Pharynx: Oropharynx is clear.  Cardiovascular:     Rate and Rhythm: Normal rate and regular rhythm.  Pulmonary:     Effort: Pulmonary effort is normal.     Breath sounds: Normal breath sounds.  Abdominal:     General: There is no distension.     Palpations: Abdomen is soft. There is no mass.     Tenderness: There is no abdominal tenderness. There is no guarding or rebound.  Neurological:     Mental Status: He is alert.        Assessment:     Patient presents with slightly over 1 week history of somewhat intermittent symptoms of fatigue, weakness, diarrhea.  Suspect viral illness.  Does not clinically appear dehydrated at this time.  No recent travels or antibiotic use.    Plan:     -  Check labs with CBC and comprehensive metabolic panel.  He had recent elevation of alkaline phosphatase and we are also recommending checking 25-hydroxy vitamin D level.  -Reviewed dietary factors related to diarrhea and appropriate foods.  Also suggested he try some over-the-counter Imodium.  -Touch base if diarrhea not resolving by next week.  Work note was written from 02/20/2019 through 02/23/2019 with return date of the [place] MD [facility]

## 2019-02-24 ENCOUNTER — Telehealth: Payer: Self-pay | Admitting: Family Medicine

## 2019-02-24 ENCOUNTER — Telehealth: Payer: Self-pay

## 2019-02-24 ENCOUNTER — Other Ambulatory Visit: Payer: Self-pay

## 2019-02-24 MED ORDER — VITAMIN D (ERGOCALCIFEROL) 1.25 MG (50000 UNIT) PO CAPS: 50000.0000 [IU] | ORAL_CAPSULE | ORAL | 0 refills | Status: DC

## 2019-02-24 NOTE — Telephone Encounter (Signed)
Copied from [place] [phone]. Topic: General - Other >> Feb 23, 2019 11:09 AM Jodie Echevaria wrote: Reason for CRM: Patient called to request a call back as soon as test results are ready please. Ph# [phone]

## 2019-02-24 NOTE — Telephone Encounter (Signed)
Copied from [place] [phone]. Topic: Quick Communication - See Telephone Encounter >> Feb 24, 2019  5:07 PM Blase Mess A wrote: CRM for notification. See Telephone encounter for: 02/24/19.  Patient is calling back for lab results Please advise. Thank you

## 2019-02-24 NOTE — Telephone Encounter (Signed)
Lab results given and documented in result note 

## 2019-02-24 NOTE — Telephone Encounter (Signed)
Called patient and LMOVM to return call  [place] for [facility] to Discuss results / PCP / recommendations / Schedule patient  Per Dr. Elease Hashimoto:  Labs OK except that Vit D level is very low. Recommend start Vit D 50,000 IU once weekly and repeat 25 OH vit d level in about 3 months.    Vit D prescription has been sent to [facility].  CRM Created.

## 2019-02-28 ENCOUNTER — Ambulatory Visit: Payer: Self-pay | Admitting: *Deleted

## 2019-02-28 ENCOUNTER — Telehealth: Payer: Self-pay

## 2019-02-28 ENCOUNTER — Encounter: Payer: Self-pay | Admitting: Internal Medicine

## 2019-02-28 DIAGNOSIS — R748 Abnormal levels of other serum enzymes: Secondary | ICD-10-CM

## 2019-02-28 DIAGNOSIS — R7989 Other specified abnormal findings of blood chemistry: Secondary | ICD-10-CM

## 2019-02-28 NOTE — Telephone Encounter (Signed)
-----   Message from Eulas Post, MD sent at 02/28/2019 10:14 AM EDT ----- My suggestion is to repeat alkaline phosphatase when he gets follow up Vit D level in 3 months.

## 2019-02-28 NOTE — Telephone Encounter (Signed)
Author phoned pt. to relay need for OV for mostly GI symptoms. OV appointment made for 3/18 at 1115AM per pt. Preference. Author also reviewed most recent lab results, and pt. Stated he started taking vitamin D already. Future labs for Vitamin D and alkaline phosphatase placed per Dr. Erick Blinks order.

## 2019-02-28 NOTE — Telephone Encounter (Signed)
Appointment last week with 02/22/19 with [facility]te, dx with viral syndrome. Prescribed Vit D. Continues to feel "awful, maybe even worse than last week" with intermittent headaches with pressure feeling around both eyes. Occasional dry cough.Below the ribcage he has  pressure that is constant rates a 9 that is not reliewed with a BM. LBM today that is beginning to form up after about 10 days of diarrhea. Denies vomiting but constantly feels like throwing up. No difficulty voiding. No CP. Fells "very tired"Shortness of breath (winded) with walking. Mouth very dry and states and he is drinking plenty of water but still feels dehydrated. Not eating much-none today, feels sick to stomach when attempting to eat. Feels chills and  hot and clammy all at once. Taking alka seltzer that does not help. Occasional dry cough. No other medications taken. Informed patient I would contact physician.Routing to PCP for advice regarding-should a follow-up appointment be scheduled for patient.

## 2019-02-28 NOTE — Telephone Encounter (Signed)
Recent labs were unremarkable.  If he has cough and dyspnea, needs follow up.

## 2019-02-28 NOTE — Telephone Encounter (Signed)
Spoke to pt. And pt. Has started vitamin D. Future lab orders placed.

## 2019-03-01 ENCOUNTER — Ambulatory Visit (INDEPENDENT_AMBULATORY_CARE_PROVIDER_SITE_OTHER): Payer: 59 | Admitting: Family Medicine

## 2019-03-01 ENCOUNTER — Encounter: Payer: Self-pay | Admitting: Family Medicine

## 2019-03-01 ENCOUNTER — Other Ambulatory Visit: Payer: Self-pay

## 2019-03-01 VITALS — BP 148/98 | HR 69 | Temp 98.3°F | Ht 67.5 in | Wt 234.7 lb

## 2019-03-01 DIAGNOSIS — R109 Unspecified abdominal pain: Secondary | ICD-10-CM

## 2019-03-01 DIAGNOSIS — R1013 Epigastric pain: Secondary | ICD-10-CM

## 2019-03-01 DIAGNOSIS — R197 Diarrhea, unspecified: Secondary | ICD-10-CM

## 2019-03-01 NOTE — Patient Instructions (Signed)
Try over the counter Pepcid 20 mg twice daily for acid reflux symptoms.   Watch for any fever or bloody stools.

## 2019-03-01 NOTE — Progress Notes (Signed)
Subjective:     Patient ID: Martin Costa, male   DOB: July 29, 1961, 58 y.o.   MRN: [id number]  HPI Patient is seen with some continued GI symptoms.  Refer to recent note from 02/22/2019 for details  "Patient is seen with some mild nausea, diarrhea, fatigue, chills without fever.  He states that he had onset of symptoms about 9 days ago on Tuesday a week ago with current symptoms.  By Friday felt much better but then over the weekend he had some extreme exhaustion.  He has only rare dry cough.  He has about 4 nonbloody diarrhea stools per day.  No recent antibiotics.  No recent travels.  He goes between chilled and feeling hot but has not had any documented fever.  He has some increased fatigue.  Has not tried any Imodium.  Still feels queasy in the stomach but has not had any vomiting in a few days.  No abdominal pain.  Surprisingly, weight is up almost 7 pounds from last visit.  Poor appetite and decreased intake past week."  We obtained CBC and comprehensive metabolic panel which were unremarkable.  Patient had had diarrhea and started some Imodium.  By Friday his stools had become relatively normal and he had no diarrhea over the weekend.  However, he woke up today with recurrent diarrhea with 3 watery nonbloody stools.  No fever.  He has some increased malaise.  His weight is stable.  Has had some dyspepsia and mild epigastric burning.  He has what he describes as diffuse abdominal cramping intermittently.  He states his stools have been slightly darker than usual.  No Pepto-Bismol use.  Does not describe any true melena.  No recent travels.  Denies cough.  No fever.  He had colonoscopy 03/27/2014 with benign polyps.  Past Medical History:  Diagnosis Date  . Arthritis   . Bronchitis    hx of  . GERD (gastroesophageal reflux disease)    hx of   Past Surgical History:  Procedure Laterality Date  . EYE SURGERY  2006   left eyelid surgery  . FLEXIBLE SIGMOIDOSCOPY N/A 04/12/2014   Procedure:  FLEXIBLE SIGMOIDOSCOPY;  Surgeon: Milus Banister, MD;  Location: [facility];  Service: Endoscopy;  Laterality: N/A;  . FOOT SURGERY  2003   tendon repair  . LEG TENDON SURGERY  2003   left  . POSTERIOR CERVICAL FUSION/FORAMINOTOMY  12/15/2012   Procedure: POSTERIOR CERVICAL FUSION/FORAMINOTOMY LEVEL 3;  Surgeon: Melina Schools, MD;  Location: [facility];  Service: Orthopedics;  Laterality: N/A;  POSTERIOR CERVICAL FUSION CERVICAL FOUR TO THORACIC TWO    reports that he has never smoked. He has never used smokeless tobacco. He reports current alcohol use. He reports that he does not use drugs. family history includes Arthritis in his father and mother; Cancer in his father; Hypertension in his mother; Kidney failure in his mother. No Known Allergies    Review of Systems  Constitutional: Positive for fatigue. Negative for chills and fever.  Respiratory: Negative for shortness of breath.   Cardiovascular: Negative for chest pain.  Gastrointestinal: Positive for diarrhea. Negative for abdominal distention, blood in stool, nausea and vomiting.  Neurological: Negative for dizziness.       Objective:   Physical Exam Constitutional:      Appearance: Normal appearance. He is not toxic-appearing or diaphoretic.  Cardiovascular:     Rate and Rhythm: Normal rate and regular rhythm.  Pulmonary:     Effort: Pulmonary effort is normal.  Breath sounds: Normal breath sounds.  Abdominal:     General: Bowel sounds are normal. There is no distension.     Palpations: Abdomen is soft.     Tenderness: There is no abdominal tenderness. There is no guarding or rebound.  Neurological:     Mental Status: He is alert.        Assessment:     Patient presents with over 2-week history of somewhat intermittent diarrhea.  He had about 9 to 10 days of diarrhea that seemed to be improving with Imodium and then recurred again today.  No bloody stools or fever.  He is describing some non-specific epigastric  burning and dyspepsia.  Recent lab work unremarkable    Plan:     -Patient previously given handout on diarrhea and appropriate diet.  Continue to avoid spicy foods, high fat foods, and high glucose containing foods  -Check stool culture  -Consider over-the-counter Pepcid 20 mg twice daily  -If symptoms not improving over the next few days consider GI consultation.  Follow-up immediately for any fever, vomiting, or progressive abdominal pain  -Patient had questions regarding coronavirus.  We explained in the absence of any fever or cough this would be very unlikely  Eulas Post MD [facility] at [facility]

## 2019-03-02 NOTE — Telephone Encounter (Signed)
Lab ordered, see telephone note.

## 2019-03-05 LAB — STOOL CULTURE
MICRO NUMBER:: 333311
MICRO NUMBER:: 333312
MICRO NUMBER:: 333313
SHIGA RESULT:: NOT DETECTED
SPECIMEN QUALITY:: ADEQUATE
SPECIMEN QUALITY:: ADEQUATE
SPECIMEN QUALITY:: ADEQUATE

## 2019-04-19 ENCOUNTER — Encounter: Payer: Self-pay | Admitting: Family Medicine

## 2019-04-19 DIAGNOSIS — Z119 Encounter for screening for infectious and parasitic diseases, unspecified: Secondary | ICD-10-CM

## 2019-04-19 NOTE — Telephone Encounter (Signed)
I have placed order for Covid 19 antibody screen for 04-20-19

## 2019-04-21 ENCOUNTER — Other Ambulatory Visit: Payer: Self-pay

## 2019-04-21 ENCOUNTER — Other Ambulatory Visit (INDEPENDENT_AMBULATORY_CARE_PROVIDER_SITE_OTHER): Payer: 59

## 2019-04-21 DIAGNOSIS — Z119 Encounter for screening for infectious and parasitic diseases, unspecified: Secondary | ICD-10-CM

## 2019-04-22 LAB — SAR COV2 SEROLOGY (COVID19)AB(IGG),IA: SARS CoV2 AB IGG: NEGATIVE

## 2019-05-18 ENCOUNTER — Encounter: Payer: Self-pay | Admitting: Internal Medicine

## 2019-06-12 ENCOUNTER — Ambulatory Visit: Payer: 59 | Admitting: *Deleted

## 2019-06-12 ENCOUNTER — Other Ambulatory Visit: Payer: Self-pay

## 2019-06-12 VITALS — Ht 71.0 in | Wt 235.0 lb

## 2019-06-12 DIAGNOSIS — Z86010 Personal history of colonic polyps: Secondary | ICD-10-CM

## 2019-06-12 MED ORDER — NA SULFATE-K SULFATE-MG SULF 17.5-3.13-1.6 GM/177ML PO SOLN: 1.0000 | Freq: Once | ORAL | 0 refills | Status: AC

## 2019-06-12 NOTE — Progress Notes (Signed)

## 2019-06-15 ENCOUNTER — Encounter: Payer: Self-pay | Admitting: Internal Medicine

## 2019-06-23 ENCOUNTER — Telehealth: Payer: Self-pay | Admitting: Internal Medicine

## 2019-06-23 NOTE — Telephone Encounter (Signed)

## 2019-06-26 ENCOUNTER — Encounter: Payer: Self-pay | Admitting: Internal Medicine

## 2019-06-26 ENCOUNTER — Other Ambulatory Visit: Payer: Self-pay

## 2019-06-26 ENCOUNTER — Ambulatory Visit (AMBULATORY_SURGERY_CENTER): Payer: 59 | Admitting: Internal Medicine

## 2019-06-26 VITALS — BP 128/82 | HR 59 | Temp 98.6°F | Resp 18 | Ht 71.0 in | Wt 235.0 lb

## 2019-06-26 DIAGNOSIS — Z1211 Encounter for screening for malignant neoplasm of colon: Secondary | ICD-10-CM | POA: Diagnosis not present

## 2019-06-26 DIAGNOSIS — Z86010 Personal history of colonic polyps: Secondary | ICD-10-CM | POA: Diagnosis not present

## 2019-06-26 DIAGNOSIS — D122 Benign neoplasm of ascending colon: Secondary | ICD-10-CM

## 2019-06-26 MED ORDER — SODIUM CHLORIDE 0.9 % IV SOLN: 500.0000 mL | Freq: Once | INTRAVENOUS | Status: AC

## 2019-06-26 NOTE — Op Note (Signed)
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 The patient's tolerance of                            previous anesthesia was also reviewed. The risks                            and benefits of the procedure and the sedation                            options and risks were discussed with the patient.                            All questions were answered, and informed consent                            was obtained. Prior Anticoagulants: The patient has                            taken no previous anticoagulant or antiplatelet                            agents. ASA Grade Assessment: II - A patient with                            mild systemic disease. After reviewing the risks  and benefits, the patient was deemed in                            satisfactory condition to undergo the procedure.                           After obtaining informed consent, the colonoscope                            was passed under direct vision. Throughout the                            procedure, the patient's blood pressure, pulse, and                            oxygen saturations were monitored continuously. The                          Colonoscope was introduced through the anus and                            advanced to the cecum, identified by appendiceal                            orifice and ileocecal valve. The colonoscopy was                            performed without difficulty. The patient tolerated                            the procedure well. The quality of the bowel                            preparation was good. The ileocecal valve,                            appendiceal orifice, and rectum were photographed. Scope In: 1:10:14 PM Scope Out: 1:22:51 PM Scope Withdrawal Time: 0 hours 8 minutes 7 seconds  Total Procedure Duration: 0 hours 12 minutes 37 seconds  Findings:                 The digital rectal exam was normal.                           A 4 mm polyp was found in the ascending colon. The                            polyp was sessile. The polyp was removed with a                            cold snare. Resection and retrieval were complete.                           Multiple small and large-mouthed diverticula were  found in the sigmoid colon, descending colon and                            ascending colon.                           A post polypectomy scar was found in the rectum.                            There was no evidence of the previous polyp.                           Internal hemorrhoids were found during                            retroflexion. The hemorrhoids were small. Complications:            No immediate complications. Estimated Blood Loss:     Estimated blood loss was minimal. Impression:               - One 4 mm polyp in the ascending colon, removed                            with a cold snare. Resected and retrieved.                           - Diverticulosis in the sigmoid colon, in the                            descending colon and in the ascending colon.                           - Post-polypectomy scar in the rectum with no                             residual polyp seen.                           - Small internal hemorrhoids. Recommendation:           - Patient has a contact number available for                            emergencies. The signs and symptoms of potential                            delayed complications were discussed with the                            patient. Return to normal activities tomorrow.                            Written discharge instructions were provided to the                            patient.                           -  Resume previous diet.                           - Continue present medications.                           - Await pathology results.                           - Repeat colonoscopy is recommended for                            surveillance. The colonoscopy date will be                            determined after pathology results from today's                            exam become available for review. Jerene Bears, MD 06/26/2019 1:31:50 PM This report has been signed electronically.

## 2019-06-26 NOTE — Progress Notes (Signed)
Pt's states no medical or surgical changes since previsit or office visit. 

## 2019-06-26 NOTE — Patient Instructions (Signed)
Impression/Recommendations:  Polyp handout given to patient. Hemorrhoid handout given to patient. Diverticulosis handout given to patient.  Resume previous diet. Continue present medications. Await pathology results.  Repeat colonoscopy recommended for surveillance.  Date to be determined after pathology results reviewed.  YOU HAD AN ENDOSCOPIC PROCEDURE TODAY AT [facility]:   Refer to the procedure report that was given to you for any specific questions about what was found during the examination.  If the procedure report does not answer your questions, please call your gastroenterologist to clarify.  If you requested that your care partner not be given the details of your procedure findings, then the procedure report has been included in a sealed envelope for you to review at your convenience later.  YOU SHOULD EXPECT: Some feelings of bloating in the abdomen. Passage of more gas than usual.  Walking can help get rid of the air that was put into your GI tract during the procedure and reduce the bloating. If you had a lower endoscopy (such as a colonoscopy or flexible sigmoidoscopy) you may notice spotting of blood in your stool or on the toilet paper. If you underwent a bowel prep for your procedure, you may not have a normal bowel movement for a few days.  Please Note:  You might notice some irritation and congestion in your nose or some drainage.  This is from the oxygen used during your procedure.  There is no need for concern and it should clear up in a day or so.  SYMPTOMS TO REPORT IMMEDIATELY:   Following lower endoscopy (colonoscopy or flexible sigmoidoscopy):  Excessive amounts of blood in the stool  Significant tenderness or worsening of abdominal pains  Swelling of the abdomen that is new, acute  Fever of 100F or higher  For urgent or emergent issues, a gastroenterologist can be reached at any hour by calling [phone].   DIET:  We do recommend a  small meal at first, but then you may proceed to your regular diet.  Drink plenty of fluids but you should avoid alcoholic beverages for 24 hours.  ACTIVITY:  You should plan to take it easy for the rest of today and you should NOT DRIVE or use heavy machinery until tomorrow (because of the sedation medicines used during the test).    FOLLOW UP: Our staff will call the number listed on your records 48-72 hours following your procedure to check on you and address any questions or concerns that you may have regarding the information given to you following your procedure. If we do not reach you, we will leave a message.  We will attempt to reach you two times.  During this call, we will ask if you have developed any symptoms of COVID 19. If you develop any symptoms (ie: fever, flu-like symptoms, shortness of breath, cough etc.) before then, please call [phone].  If you test positive for Covid 19 in the 2 weeks post procedure, please call and report this information to [place].    If any biopsies were taken you will be contacted by phone or by letter within the next 1-3 weeks.  Please call us at [phone] if you have not heard about the biopsies in 3 weeks.    SIGNATURES/CONFIDENTIALITY: You and/or your care partner have signed paperwork which will be entered into your electronic medical record.  These signatures attest to the fact that that the information above on your After Visit Summary has been reviewed and is understood.  Full responsibility of the confidentiality of this discharge information lies with you and/or your care-partner.

## 2019-06-26 NOTE — Progress Notes (Signed)
Called to room to assist during endoscopic procedure.  Patient ID and intended procedure confirmed with present staff. Received instructions for my participation in the procedure from the performing physician.  

## 2019-06-26 NOTE — Progress Notes (Signed)
Report given to PACU, vss 

## 2019-06-28 ENCOUNTER — Telehealth: Payer: Self-pay

## 2019-06-28 NOTE — Telephone Encounter (Signed)
No answer, unable to leave a message, B.Genine Beckett RN. 

## 2019-06-28 NOTE — Telephone Encounter (Signed)
No answer, unable to leave a message, B.Lakeria Starkman RN. 

## 2019-07-03 ENCOUNTER — Encounter: Payer: Self-pay | Admitting: Internal Medicine

## 2019-10-15 ENCOUNTER — Other Ambulatory Visit: Payer: Self-pay | Admitting: Family Medicine

## 2019-10-17 NOTE — Telephone Encounter (Signed)
The original prescription was discontinued on 06/26/2019 by Nelva Bush, RN for the following reason: Completed Course. Renewing this prescription may not be appropriate.

## 2020-01-04 ENCOUNTER — Encounter: Payer: Self-pay | Admitting: Family Medicine

## 2020-01-05 MED ORDER — ALLOPURINOL 100 MG PO TABS: ORAL_TABLET | ORAL | 0 refills | Status: AC

## 2020-01-05 MED ORDER — COLCHICINE 0.6 MG PO TABS: ORAL_TABLET | ORAL | 2 refills | Status: DC

## 2020-05-03 ENCOUNTER — Encounter: Payer: Self-pay | Admitting: Family Medicine

## 2020-05-03 ENCOUNTER — Other Ambulatory Visit: Payer: Self-pay

## 2020-05-03 NOTE — Telephone Encounter (Signed)
Pt has been scheduled.  °

## 2020-05-06 ENCOUNTER — Encounter: Payer: Self-pay | Admitting: Family Medicine

## 2020-05-06 ENCOUNTER — Other Ambulatory Visit: Payer: Self-pay

## 2020-05-06 ENCOUNTER — Ambulatory Visit (INDEPENDENT_AMBULATORY_CARE_PROVIDER_SITE_OTHER): Payer: 59 | Admitting: Family Medicine

## 2020-05-06 VITALS — BP 134/86 | HR 86 | Temp 98.1°F | Wt 234.6 lb

## 2020-05-06 DIAGNOSIS — R109 Unspecified abdominal pain: Secondary | ICD-10-CM | POA: Diagnosis not present

## 2020-05-06 LAB — CBC WITH DIFFERENTIAL/PLATELET
Basophils Absolute: 0 10*3/uL (ref 0.0–0.1)
Basophils Relative: 0.5 % (ref 0.0–3.0)
Eosinophils Absolute: 0 10*3/uL (ref 0.0–0.7)
Eosinophils Relative: 0.4 % (ref 0.0–5.0)
HCT: 40.9 % (ref 39.0–52.0)
Hemoglobin: 14.1 g/dL (ref 13.0–17.0)
Lymphocytes Relative: 28 % (ref 12.0–46.0)
Lymphs Abs: 2.4 10*3/uL (ref 0.7–4.0)
MCHC: 34.4 g/dL (ref 30.0–36.0)
MCV: 93.4 fl (ref 78.0–100.0)
Monocytes Absolute: 0.8 10*3/uL (ref 0.1–1.0)
Monocytes Relative: 9.3 % (ref 3.0–12.0)
Neutro Abs: 5.2 10*3/uL (ref 1.4–7.7)
Neutrophils Relative %: 61.8 % (ref 43.0–77.0)
Platelets: 301 10*3/uL (ref 150.0–400.0)
RBC: 4.38 Mil/uL (ref 4.22–5.81)
RDW: 14.5 % (ref 11.5–15.5)
WBC: 8.5 10*3/uL (ref 4.0–10.5)

## 2020-05-06 LAB — POCT URINALYSIS DIPSTICK
Blood, UA: NEGATIVE
Glucose, UA: NEGATIVE
Ketones, UA: NEGATIVE
Leukocytes, UA: NEGATIVE
Nitrite, UA: NEGATIVE
Odor: NEGATIVE
Protein, UA: POSITIVE — AB
Spec Grav, UA: 1.025 (ref 1.010–1.025)
Urobilinogen, UA: 0.2 E.U./dL
pH, UA: 6 (ref 5.0–8.0)

## 2020-05-06 LAB — HEPATIC FUNCTION PANEL
ALT: 24 U/L (ref 0–53)
AST: 34 U/L (ref 0–37)
Albumin: 4.5 g/dL (ref 3.5–5.2)
Alkaline Phosphatase: 337 U/L — ABNORMAL HIGH (ref 39–117)
Bilirubin, Direct: 0.2 mg/dL (ref 0.0–0.3)
Total Bilirubin: 0.7 mg/dL (ref 0.2–1.2)
Total Protein: 7.2 g/dL (ref 6.0–8.3)

## 2020-05-06 LAB — PSA: PSA: 0.98 ng/mL (ref 0.10–4.00)

## 2020-05-06 LAB — BASIC METABOLIC PANEL
BUN: 17 mg/dL (ref 6–23)
CO2: 26 mEq/L (ref 19–32)
Calcium: 9.1 mg/dL (ref 8.4–10.5)
Chloride: 101 mEq/L (ref 96–112)
Creatinine, Ser: 1.33 mg/dL (ref 0.40–1.50)
GFR: 66.93 mL/min (ref >60.00)
Glucose, Bld: 100 mg/dL — ABNORMAL HIGH (ref 70–99)
Potassium: 3.9 mEq/L (ref 3.5–5.1)
Sodium: 138 mEq/L (ref 135–145)

## 2020-05-06 LAB — SEDIMENTATION RATE: Sed Rate: 18 mm/hr (ref 0–20)

## 2020-05-06 NOTE — Progress Notes (Signed)
Subjective:     Patient ID: Martin Costa, male   DOB: 10/18/1961, 59 y.o.   MRN: [id number]  HPI Cai is seen with left flank pain for about 5 to 7 days.  Started off somewhat intermittent but now continuous.  Is somewhat worse with movement.  No known injury.  He took some Aleve without improvement.  Severity is moderate.  He has had possibly little bit of urine frequency but no burning with urination.  No fevers or chills.  No appetite or weight changes.  He has not noted any skin rashes.  No alleviating factors.  He does relate that he has been drinking perhaps more alcohol than usual.  He has been drinking up to a full bottle of tequila per day on the weekends.  Denies any anterior abdominal pain.  He has history of elevated alkaline phosphatase in the 200 range and this was repeated a few times over a year ago and stable..  No evidence for liver origin.  Past Medical History:  Diagnosis Date  . Arthritis   . Bronchitis    hx of  . GERD (gastroesophageal reflux disease)    hx of   Past Surgical History:  Procedure Laterality Date  . COLONOSCOPY    . EYE SURGERY  2006   left eyelid surgery  . FLEXIBLE SIGMOIDOSCOPY N/A 04/12/2014   Procedure: FLEXIBLE SIGMOIDOSCOPY;  Surgeon: Milus Banister, MD;  Location: [facility];  Service: Endoscopy;  Laterality: N/A;  . FOOT SURGERY  2003   tendon repair  . LEG TENDON SURGERY  2003   left  . POLYPECTOMY    . POSTERIOR CERVICAL FUSION/FORAMINOTOMY  12/15/2012   Procedure: POSTERIOR CERVICAL FUSION/FORAMINOTOMY LEVEL 3;  Surgeon: Melina Schools, MD;  Location: [facility];  Service: Orthopedics;  Laterality: N/A;  POSTERIOR CERVICAL FUSION CERVICAL FOUR TO THORACIC TWO    reports that he has never smoked. He has never used smokeless tobacco. He reports current alcohol use. He reports that he does not use drugs. family history includes Arthritis in his father and mother; Cancer in his father; Hypertension in his mother; Kidney failure in his  mother. No Known Allergies   Review of Systems  Constitutional: Negative for appetite change, chills, fever and unexpected weight change.  Respiratory: Negative for cough and shortness of breath.   Cardiovascular: Negative for chest pain.  Gastrointestinal: Negative for blood in stool, diarrhea, nausea and vomiting.  Genitourinary: Positive for flank pain and frequency. Negative for difficulty urinating, dysuria and hematuria.  Skin: Negative for rash.  Hematological: Negative for adenopathy.       Objective:   Physical Exam Vitals reviewed.  Constitutional:      Appearance: He is well-developed.  Cardiovascular:     Rate and Rhythm: Normal rate and regular rhythm.  Pulmonary:     Effort: Pulmonary effort is normal.     Breath sounds: Normal breath sounds.  Abdominal:     General: Bowel sounds are normal. There is no distension.     Palpations: Abdomen is soft. There is no hepatomegaly, splenomegaly or mass.     Tenderness: There is no abdominal tenderness.     Hernia: No hernia is present.  Neurological:     Mental Status: He is alert.        Assessment:     Patient presents with approximately 1 week history of left flank pain which is become relatively continuous.  This is worse with movement which suggest musculoskeletal origin.  He does not have  any red flags such as weight loss, fever, recent injury    Plan:     -Obtain further labs with CBC, hepatic panel, basic metabolic panel, sed rate, PSA, urine dip -He is advised to scale back alcohol use -Consider imaging starting with plain rib films if pain not improving over the next week or 2  Eulas Post MD [facility] at [facility]

## 2020-05-06 NOTE — Patient Instructions (Signed)
We will call with labs  Watch for any fever, progressive pain, skin rash or other new symptoms.

## 2020-05-07 NOTE — Addendum Note (Signed)
Addended by: Eulas Post on: 05/07/2020 05:51 PM   Modules accepted: Orders

## 2020-05-14 ENCOUNTER — Encounter: Payer: Self-pay | Admitting: Family Medicine

## 2020-05-14 ENCOUNTER — Other Ambulatory Visit: Payer: Self-pay

## 2020-05-14 ENCOUNTER — Ambulatory Visit (INDEPENDENT_AMBULATORY_CARE_PROVIDER_SITE_OTHER): Payer: 59 | Admitting: Family Medicine

## 2020-05-14 VITALS — BP 138/86 | HR 81 | Temp 97.8°F | Ht 71.0 in | Wt 232.7 lb

## 2020-05-14 DIAGNOSIS — Z0000 Encounter for general adult medical examination without abnormal findings: Secondary | ICD-10-CM | POA: Diagnosis not present

## 2020-05-14 DIAGNOSIS — R748 Abnormal levels of other serum enzymes: Secondary | ICD-10-CM | POA: Diagnosis not present

## 2020-05-14 MED ORDER — VITAMIN D (ERGOCALCIFEROL) 1.25 MG (50000 UNIT) PO CAPS: 50000.0000 [IU] | ORAL_CAPSULE | ORAL | 1 refills | Status: DC

## 2020-05-14 NOTE — Progress Notes (Signed)
Subjective:     Patient ID: Martin Costa, male   DOB: 1961/06/06, 59 y.o.   MRN: [id number]  HPI Woodruff is seen for physical exam. He was seen recently with some left side pain. That pain has fully resolved at this time. We got some follow-up labs. He had a sed rate that was normal. Creatinine was slightly elevated 1.33 from his prior baseline. His alkaline phosphatase was up over 300 and has been up previously elevated. Has never had other liver transaminase elevations and 5 prime nucleotidase level was normal couple years ago. He has no history of hypercalcemia. He does have history of vitamin D deficiency with level of 18 couple years ago  He has had Covid vaccinations. Previous hepatitis C screen negative. Tetanus due 2028. Colonoscopy due 7/27. PSA 0.98 from recent labs.  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 He is unfortunately going through separation from his wife of 3 years. They have been together for 13 years. This has naturally caused some stress. He has been drinking more tequila recently which he attributes to the stress issues. Blood pressure has been borderline high.  Past Medical History:  Diagnosis Date  . Arthritis   . Bronchitis    hx of  . GERD (gastroesophageal reflux disease)    hx of   Past Surgical History:  Procedure Laterality Date  . COLONOSCOPY    . EYE SURGERY  2006   left eyelid surgery  . FLEXIBLE SIGMOIDOSCOPY N/A 04/12/2014   Procedure: FLEXIBLE SIGMOIDOSCOPY;  Surgeon: Milus Banister, MD;  Location: [facility];  Service: Endoscopy;  Laterality: N/A;  . FOOT SURGERY  2003   tendon repair  . LEG TENDON SURGERY  2003   left  . POLYPECTOMY    . POSTERIOR CERVICAL FUSION/FORAMINOTOMY  12/15/2012   Procedure: POSTERIOR CERVICAL FUSION/FORAMINOTOMY LEVEL 3;  Surgeon: Melina Schools, MD;  Location: [facility];  Service: Orthopedics;  Laterality: N/A;  POSTERIOR CERVICAL  FUSION CERVICAL FOUR TO THORACIC TWO    reports that he has never smoked. He has never used smokeless tobacco. He reports current alcohol use. He reports that he does not use drugs. family history includes Arthritis in his father and mother; Cancer in his father; Hypertension in his mother; Kidney failure in his mother. No Known Allergies   Review of Systems  Constitutional: Negative for activity change, appetite change, fatigue and fever.  HENT: Negative for congestion, ear pain and trouble swallowing.   Eyes: Negative for pain and visual disturbance.  Respiratory: Negative for cough, chest tightness, shortness of breath and wheezing.   Cardiovascular: Negative for chest pain, palpitations and leg swelling.  Gastrointestinal: Negative for abdominal distention, abdominal pain, blood in stool, constipation, diarrhea, nausea, rectal pain and vomiting.  Endocrine: Negative for polydipsia and polyuria.  Genitourinary: Negative for dysuria, hematuria and testicular pain.  Musculoskeletal: Negative for arthralgias and joint swelling.  Skin: Negative for rash.  Neurological: Negative for dizziness, syncope, weakness, light-headedness and headaches.  Hematological: Negative for adenopathy.  Psychiatric/Behavioral: Negative for confusion and dysphoric mood.       Objective:   Physical Exam Constitutional:      General: He is not in acute distress.    Appearance: He is well-developed.  HENT:     Head: Normocephalic and atraumatic.     Right Ear: External ear normal.     Left Ear: External ear normal.  Eyes:  Conjunctiva/sclera: Conjunctivae normal.     Pupils: Pupils are equal, round, and reactive to light.  Neck:     Thyroid: No thyromegaly.  Cardiovascular:     Rate and Rhythm: Normal rate and regular rhythm.     Heart sounds: Normal heart sounds. No murmur.  Pulmonary:     Effort: No respiratory distress.     Breath sounds: No wheezing or rales.  Abdominal:     General: Bowel  sounds are normal. There is no distension.     Palpations: Abdomen is soft. There is no mass.     Tenderness: There is no abdominal tenderness. There is no guarding or rebound.  Musculoskeletal:     Cervical back: Normal range of motion and neck supple.     Right lower leg: No edema.     Left lower leg: No edema.  Lymphadenopathy:     Cervical: No cervical adenopathy.  Skin:    Findings: No rash.  Neurological:     Mental Status: He is alert and oriented to person, place, and time.     Cranial Nerves: No cranial nerve deficit.     Deep Tendon Reflexes: Reflexes normal.        Assessment:     #1 physical exam. He has history of gout which is stable on allopurinol. Blood pressure technically normal but high normal on exam today  #2 history of mildly elevated alkaline phosphatase. Possibly related to vitamin D deficiency. No history of hypercalcemia to suggest primary hyperparathyroidism. Paget's disease of bone would be unlikely    Plan:     -Start vitamin D 50,000 international units once weekly and recheck labs in 3 months including 25-hydroxy vitamin D, basic metabolic panel, alkaline phosphatase  -Strongly encouraged him to lose some weight and also scale back alcohol use and blood pressure should improve. We will plan to reassess blood pressure at 14-month follow-up  -Discussed shingles vaccine at follow-up  Eulas Post MD [facility]

## 2020-05-14 NOTE — Patient Instructions (Addendum)
Start the vitamin D one capsule once weekly  Set up 3 month follow up labs.  Cut back on the alcohol  Try to lose some weight- this should help the blood pressure.

## 2020-07-29 ENCOUNTER — Telehealth: Payer: Self-pay | Admitting: Family Medicine

## 2020-07-29 NOTE — Telephone Encounter (Signed)
Pt is asking for a referral to see a foot dr or have an xray to his left heal. Has been going on for a week. Wants to be seen immediately  Call pt [phone]

## 2020-07-30 ENCOUNTER — Ambulatory Visit (INDEPENDENT_AMBULATORY_CARE_PROVIDER_SITE_OTHER): Payer: 59 | Admitting: Family Medicine

## 2020-07-30 ENCOUNTER — Encounter: Payer: Self-pay | Admitting: Family Medicine

## 2020-07-30 ENCOUNTER — Other Ambulatory Visit: Payer: Self-pay

## 2020-07-30 VITALS — BP 144/90 | HR 68 | Temp 98.3°F | Ht 71.0 in | Wt 230.5 lb

## 2020-07-30 DIAGNOSIS — M7660 Achilles tendinitis, unspecified leg: Secondary | ICD-10-CM | POA: Diagnosis not present

## 2020-07-30 NOTE — Telephone Encounter (Signed)
Pt has appt this afternoon with pcp.

## 2020-07-30 NOTE — Patient Instructions (Signed)
Rosen's Emergency Medicine: Concepts and Clinical Practice (9th ed., pp. 1392-1401). [place], [place]: Elsevier, Inc. Retrieved from https://www.clinicalkey.com/#!/content/book/3-[email or web address]-[email or web address]?scrollTo=%[email or web address]">  Achilles Tendinitis  Achilles tendinitis is inflammation of the tough, cord-like band that attaches the lower leg muscles to the heel bone (Achilles tendon). This is usually caused by overusing the tendon and the ankle joint. Achilles tendinitis usually gets better over time with treatment and caring for yourself at home. It can take weeks or months to heal completely. What are the causes? This condition may be caused by:  A sudden increase in exercise or activity, such as running.  Doing the same exercises or activities, such as jumping, over and over.  Not warming up calf muscles before exercising.  Exercising in shoes that are worn out or not made for exercise.  Having arthritis or a bone growth (spur) on the back of the heel bone. This can rub against the tendon and hurt it.  Age-related wear and tear. Tendons become less flexible with age and are more likely to be injured. What are the signs or symptoms? Common symptoms of this condition include:  Pain in the Achilles tendon or in the back of the leg, just above the heel. The pain usually gets worse with exercise.  Stiffness or soreness in the back of the leg, especially in the morning.  Swelling of the skin over the Achilles tendon.  Thickening of the tendon.  Trouble standing on tiptoe. How is this diagnosed? This condition is diagnosed based on your symptoms and a physical exam. You may have tests, including:  X-rays.  MRI. How is this treated? The goal of treatment is to relieve symptoms and help your injury heal. Treatment may include:  Decreasing or stopping activities that caused the tendinitis. This may mean switching to low-impact exercises like biking or swimming.  Icing the injured  area.  Doing physical therapy, including strengthening and stretching exercises.  Taking NSAIDs, such as ibuprofen, to help relieve pain and swelling.  Using supportive shoes, wraps, heel lifts, or a walking boot (air cast).  Having surgery. This may be done if your symptoms do not improve after other treatments.  Using high-energy shock wave impulses to stimulate the healing process (extracorporeal shock wave therapy). This is rare.  Having an injection of medicines that help relieve inflammation (corticosteroids). This is rare. Follow these instructions at home: If you have an air cast:  Wear the air cast as told by your health care provider. Remove it only as told by your health care provider.  Loosen it if your toes tingle, become numb, or turn cold and blue.  Keep it clean.  If the air cast is not waterproof: ? Do not let it get wet. ? Cover it with a watertight covering when you take a bath or shower. Managing pain, stiffness, and swelling   If directed, put ice on the injured area. To do this: ? If you have a removable air cast, remove it as told by your health care provider. ? Put ice in a plastic bag. ? Place a towel between your skin and the bag. ? Leave the ice on for 20 minutes, 2-3 times a day.  Move your toes often to reduce stiffness and swelling.  Raise (elevate) your foot above the level of your heart while you are sitting or lying down. Activity  Gradually return to your normal activities as told by your health care provider. Ask your health care provider what activities are safe for you.  Do not do   activities that cause pain.  Consider doing low-impact exercises, like cycling or swimming.  Ask your health care provider when it is safe to drive if you have an air cast on your foot.  If physical therapy was prescribed, do exercises as told by your health care provider or physical therapist. General instructions  If directed, wrap your foot with an  elastic bandage or other wrap. This can help to keep your tendon from moving too much while it heals. Your health care provider will show you how to wrap your foot correctly.  Wear supportive shoes or heel lifts only as told by your health care provider.  Take over-the-counter and prescription medicines only as told by your health care provider.  Keep all follow-up visits as told by your health care provider. This is important. Contact a health care provider if you:  Have symptoms that get worse.  Have pain that does not get better with medicine.  Develop new, unexplained symptoms.  Develop warmth and swelling in your foot.  Have a fever. Get help right away if you:  Have a sudden popping sound or sensation in your Achilles tendon followed by severe pain.  Cannot move your toes or foot.  Cannot put any weight on your foot.  Your foot or toes become numb and look white or blue even after loosening your bandage or air cast. Summary  Achilles tendinitis is inflammation of the tough, cord-like band that attaches the lower leg muscles to the heel bone (Achilles tendon).  This condition is usually caused by overusing the tendon and the ankle joint. It can also be caused by arthritis or normal aging.  The most common symptoms of this condition include pain, swelling, or stiffness in the Achilles tendon or in the back of the leg.  This condition is usually treated by decreasing or stopping activities that caused the tendinitis, icing the injured area, taking NSAIDs, and doing physical therapy. This information is not intended to replace advice given to you by your health care provider. Make sure you discuss any questions you have with your health care provider. Document Revised: 04/17/2019 Document Reviewed: 04/17/2019 Elsevier Patient Education  [place].  Consider trial of OTC Diclofenac gel and apply 3-4 times daily.    I will set up sports medicine referral.

## 2020-07-30 NOTE — Progress Notes (Signed)
Established Patient Office Visit  Subjective:  Patient ID: Martin Costa, male    DOB: 08-Sep-1961  Age: 59 y.o. MRN: [id number]  CC:  Chief Complaint  Patient presents with  . Pain    patient complains of left Achilles pain x1 week, used anti-inflammatories and rest with no relief    HPI Martin Costa presents for left Achilles pain for 1 week.  He recently started doing more treadmill workouts about 4 times per week in order to try to lose some weight.  He recalls the day before he woke up with pain doing a workout but had no pain with his exercise.  When he got up the next morning he had some soreness.  His pain is near the attachment of the Achilles to the calcaneus.  Denies any recent injury.  No sense of instability.  Pain with ambulation.  He has not done any real icing.  He has not taken any medications.  Past Medical History:  Diagnosis Date  . Arthritis   . Bronchitis    hx of  . GERD (gastroesophageal reflux disease)    hx of    Past Surgical History:  Procedure Laterality Date  . COLONOSCOPY    . EYE SURGERY  2006   left eyelid surgery  . FLEXIBLE SIGMOIDOSCOPY N/A 04/12/2014   Procedure: FLEXIBLE SIGMOIDOSCOPY;  Surgeon: Milus Banister, MD;  Location: [facility];  Service: Endoscopy;  Laterality: N/A;  . FOOT SURGERY  2003   tendon repair  . LEG TENDON SURGERY  2003   left  . POLYPECTOMY    . POSTERIOR CERVICAL FUSION/FORAMINOTOMY  12/15/2012   Procedure: POSTERIOR CERVICAL FUSION/FORAMINOTOMY LEVEL 3;  Surgeon: Melina Schools, MD;  Location: [facility];  Service: Orthopedics;  Laterality: N/A;  POSTERIOR CERVICAL FUSION CERVICAL FOUR TO THORACIC TWO    Family History  Problem Relation Age of Onset  . Arthritis Mother   . Hypertension Mother   . Kidney failure Mother   . Arthritis Father   . Cancer Father        ?prostate cancer  . Colon cancer Neg Hx   . Esophageal cancer Neg Hx   . Pancreatic cancer Neg Hx   . Rectal cancer Neg Hx   . Stomach cancer Neg Hx     . Colon polyps Neg Hx   . Diabetes Neg Hx   . Heart disease Neg Hx     Social History   Socioeconomic History  . Marital status: Single    Spouse name: Not on file  . Number of children: Not on file  . Years of education: Not on file  . Highest education level: Not on file  Occupational History  . Not on file  Tobacco Use  . Smoking status: Never Smoker  . Smokeless tobacco: Never Used  Vaping Use  . Vaping Use: Never used  Substance and Sexual Activity  . Alcohol use: Yes    Comment: "social"  . Drug use: No  . Sexual activity: Not on file  Other Topics Concern  . Not on file  Social History Narrative  . Not on file   Social Determinants of Health   Financial Resource Strain:   . Difficulty of Paying Living Expenses:   Food Insecurity:   . Worried About Charity fundraiser in the Last Year:   . Arboriculturist in the Last Year:   Transportation Needs:   . Film/video editor (Medical):   Marland Kitchen Lack of Transportation (  Non-Medical):   Physical Activity:   . Days of Exercise per Week:   . Minutes of Exercise per Session:   Stress:   . Feeling of Stress :   Social Connections:   . Frequency of Communication with Friends and Family:   . Frequency of Social Gatherings with Friends and Family:   . Attends Religious Services:   . Active Member of Clubs or Organizations:   . Attends Archivist Meetings:   Marland Kitchen Marital Status:   Intimate Partner Violence:   . Fear of Current or Ex-Partner:   . Emotionally Abused:   Marland Kitchen Physically Abused:   . Sexually Abused:     Outpatient Medications Prior to Visit  Medication Sig Dispense Refill  . acetaminophen (TYLENOL) 500 MG tablet Take 500 mg by mouth every 6 (six) hours as needed.    Marland Kitchen allopurinol (ZYLOPRIM) 100 MG tablet Take one daily for 2 weeks and then two daily for 2 weeks and then three daily. 90 tablet 0  . colchicine 0.6 MG tablet 2 at onset of gout flare and then 1 every 12 hours 30 tablet 2  . LOTEMAX SM  0.38 % GEL     . Vitamin D, Ergocalciferol, (DRISDOL) 1.25 MG (50000 UNIT) CAPS capsule Take 1 capsule (50,000 Units total) by mouth every 7 (seven) days. 12 capsule 1   Facility-Administered Medications Prior to Visit  Medication Dose Route Frequency Provider Last Rate Last Admin  . 0.9 %  sodium chloride infusion  500 mL Intravenous Once Pyrtle, Lajuan Lines, MD        No Known Allergies  ROS Review of Systems  Neurological: Negative for weakness and numbness.      Objective:    Physical Exam Vitals reviewed.  Constitutional:      Appearance: Normal appearance.  Cardiovascular:     Rate and Rhythm: Normal rate and regular rhythm.  Musculoskeletal:     Comments: Left foot reveals no bony tenderness.  He has some tenderness near the attachment of the Achilles tendon to the calcaneus.  Mild retrocalcaneal tenderness.  No visible swelling, erythema, or warmth.  He is able to fully plantarflex and dorsiflex though he has some pain with dorsiflexion and plantarflexion of the distal Achilles tendon.  We do not appreciate any nodules or abnormal thickening of the tendon  Neurological:     Mental Status: He is alert.     BP (!) 144/90 (BP Location: Left Arm, Patient Position: Sitting, Cuff Size: Large)   Pulse 68   Temp 98.3 F (36.8 C) (Oral)   Ht 5\' 11"  (1.803 m)   Wt 230 lb 8 oz (104.6 kg)   BMI 32.15 kg/m  Wt Readings from Last 3 Encounters:  07/30/20 230 lb 8 oz (104.6 kg)  05/14/20 232 lb 11.2 oz (105.6 kg)  05/06/20 234 lb 9.6 oz (106.4 kg)     There are no preventive care reminders to display for this patient.  There are no preventive care reminders to display for this patient.  Lab Results  Component Value Date   TSH 1.46 08/11/2017   Lab Results  Component Value Date   WBC 8.5 05/06/2020   HGB 14.1 05/06/2020   HCT 40.9 05/06/2020   MCV 93.4 05/06/2020   PLT 301.0 05/06/2020   Lab Results  Component Value Date   NA 138 05/06/2020   K 3.9 05/06/2020   CO2  26 05/06/2020   GLUCOSE 100 (H) 05/06/2020   BUN 17 05/06/2020  CREATININE 1.33 05/06/2020   BILITOT 0.7 05/06/2020   ALKPHOS 337 (H) 05/06/2020   AST 34 05/06/2020   ALT 24 05/06/2020   PROT 7.2 05/06/2020   ALBUMIN 4.5 05/06/2020   CALCIUM 9.1 05/06/2020   GFR 66.93 05/06/2020   Lab Results  Component Value Date   CHOL 168 08/11/2017   Lab Results  Component Value Date   HDL 54.40 08/11/2017   Lab Results  Component Value Date   LDLCALC 96 08/11/2017   Lab Results  Component Value Date   TRIG 88.0 08/11/2017   Lab Results  Component Value Date   CHOLHDL 3 08/11/2017   No results found for: HGBA1C    Assessment & Plan:   Problem List Items Addressed This Visit    None    Visit Diagnoses    Achilles tendon pain    -  Primary   Relevant Orders   Ambulatory referral to Sports Medicine    Suspect Achilles tendinitis.  He does not describe any recent injury We recommend try icing 15 to 20 minutes 3-4 times daily Recommend trial of diclofenac gel 3-4 times daily Gentle stretches as tolerated Patient would like to have further assessed through sports medicine and we will set up.  No orders of the defined types were placed in this encounter.   Follow-up: No follow-ups on file.    Carolann Littler, MD

## 2020-08-05 ENCOUNTER — Other Ambulatory Visit: Payer: Self-pay

## 2020-08-05 ENCOUNTER — Ambulatory Visit (INDEPENDENT_AMBULATORY_CARE_PROVIDER_SITE_OTHER): Payer: 59 | Admitting: Family Medicine

## 2020-08-05 ENCOUNTER — Encounter: Payer: Self-pay | Admitting: Family Medicine

## 2020-08-05 ENCOUNTER — Ambulatory Visit: Payer: Self-pay

## 2020-08-05 VITALS — BP 160/100 | HR 84 | Ht 71.0 in | Wt 228.8 lb

## 2020-08-05 DIAGNOSIS — M7662 Achilles tendinitis, left leg: Secondary | ICD-10-CM

## 2020-08-05 MED ORDER — NITROGLYCERIN 0.2 MG/HR TD PT24: MEDICATED_PATCH | TRANSDERMAL | 1 refills | Status: AC

## 2020-08-05 NOTE — Progress Notes (Signed)
Subjective:    I'm seeing this patient as a consultation for:  Dr. Elease Hashimoto. Note will be routed back to referring provider/PCP.  CC: L Achille's pain  I, Molly Weber, LAT, ATC, am serving as scribe for Dr. Lynne Leader.  HPI: Pt is a 59 y/o male presenting w/ c/o L Achille's pain x approximately 2 weeks after doing a cardio workout on the treadmill.  He typically works out on the treadmill 4x/week.  He locates his pain to his distal Achille's near it's attachment to the Achille's.  He typically walks on a treadmill up a hill.  Has been doing this for a while before he got hurt.  Radiating pain: No Swelling: yes, intermittently Aggravating factors: walking; L ankle DF/PF; pressure to the area Treatments tried: Ice; Voltaren gel; IBU  Past medical history, Surgical history, Family history, Social history, Allergies, and medications have been entered into the medical record, reviewed.   Review of Systems: No new headache, visual changes, nausea, vomiting, diarrhea, constipation, dizziness, abdominal pain, skin rash, fevers, chills, night sweats, weight loss, swollen lymph nodes, body aches, joint swelling, muscle aches, chest pain, shortness of breath, mood changes, visual or auditory hallucinations.   Objective:    Vitals:   08/05/20 1456  BP: (!) 160/100  Pulse: 84  SpO2: 98%   General: Well Developed, well nourished, and in no acute distress.  Neuro/Psych: Alert and oriented x3, extra-ocular muscles intact, able to move all 4 extremities, sensation grossly intact. Skin: Warm and dry, no rashes noted.  Respiratory: Not using accessory muscles, speaking in full sentences, trachea midline.  Cardiovascular: Pulses palpable, no extremity edema. Abdomen: Does not appear distended. MSK: Left heel slight nodule at distal Achilles tendon insertion and posterior calcaneus otherwise normal-appearing Tender palpation it distal Achilles tendon insertion at posterior calcaneus. Normal ankle  motion.  Normal strength.  Stable ligamentous exam. Pulses cap refill and sensation are intact distally.  .  Antalgic gait  Lab and Radiology Results Diagnostic Limited MSK Ultrasound of: Left Achilles tendon Normal-appearing tendon until insertion at calcaneus where large heel spur is present. Small amount of hypoechoic fluid tracking superficial to heel spur and Achilles tendon at area of maximum tenderness. Impression: Achilles tendinitis heel spur and possible bursitis   Impression and Recommendations:    Assessment and Plan: 59 y.o. male with Achilles tendinitis with heel spur with possible bursitis present.  Discussed options.  Plan for eccentric exercises and nitroglycerin patch protocol.  Use cam walker boot limited as needed.  If not improving return to clinic in a few weeks would consider injection of bursa if possible at that time.  Would like to avoid steroid injection around Achilles tendon if possible..   Orders Placed This Encounter  Procedures  . [place] LIMITED JOINT SPACE STRUCTURES LOW LEFT(NO LINKED CHARGES)    Order Specific Question:   Reason for Exam (SYMPTOM  OR DIAGNOSIS REQUIRED)    Answer:   L Achille's    Order Specific Question:   Preferred imaging location?    Answer:   [facility]   Meds ordered this encounter  Medications  . nitroGLYCERIN (NITRODUR - DOSED IN MG/24 HR) 0.2 mg/hr patch    Sig: Apply 1/4 patch daily to tendon for tendonitis.    Dispense:  30 patch    Refill:  1    Discussed warning signs or symptoms. Please see discharge instructions. Patient expresses understanding.   The above documentation has been reviewed and is accurate and complete  Lynne Leader, M.D.

## 2020-08-05 NOTE — Patient Instructions (Addendum)
Thank you for coming in today. Plan for the exercise we reviewed.  Do about 10-30 reps 2-3x daily.  Go from up to down slowly.  Use the nitro patches.  Also ok to use voltaren gel and tylneol.  Avoid oral ibuprofen or aleve because I think it is driving up your blood pressure.  Consider Cam Walker boot as needed.   If not improving in a few weeks let me know.  There is more to do.    Nitroglycerin Protocol   Apply 1/4 nitroglycerin patch to affected area daily.  Change position of patch within the affected area every 24 hours.  You may experience a headache during the first 1-2 weeks of using the patch, these should subside.  If you experience headaches after beginning nitroglycerin patch treatment, you may take your preferred over the counter pain reliever.  Another side effect of the nitroglycerin patch is skin irritation or rash related to patch adhesive.  Please notify our office if you develop more severe headaches or rash, and stop the patch.  Tendon healing with nitroglycerin patch may require 12 to 24 weeks depending on the extent of injury.  Men should not use if taking Viagra, Cialis, or Levitra.   Do not use if you have migraines or rosacea.

## 2020-12-31 ENCOUNTER — Encounter: Payer: Self-pay | Admitting: Family Medicine

## 2021-01-25 ENCOUNTER — Other Ambulatory Visit: Payer: Self-pay | Admitting: Family Medicine

## 2021-01-25 ENCOUNTER — Encounter: Payer: Self-pay | Admitting: Family Medicine

## 2021-01-27 MED ORDER — COLCHICINE 0.6 MG PO TABS: ORAL_TABLET | ORAL | 2 refills | Status: DC

## 2021-01-27 NOTE — Telephone Encounter (Signed)
allopurinol (ZYLOPRIM) 100 MG tablet  colchicine 0.6 MG tablet   [id number] - [place], [place] - [address] AT [place] Phone:  [phone]  Fax:  [phone]

## 2022-01-17 ENCOUNTER — Other Ambulatory Visit: Payer: Self-pay | Admitting: Family Medicine

## 2022-07-24 ENCOUNTER — Encounter: Payer: Self-pay | Admitting: Family Medicine

## 2022-07-27 ENCOUNTER — Other Ambulatory Visit: Payer: Self-pay | Admitting: Family Medicine

## 2022-07-27 MED ORDER — COLCHICINE 0.6 MG PO TABS: ORAL_TABLET | ORAL | 0 refills | Status: AC

## 2022-07-28 MED ORDER — PREDNISONE 20 MG PO TABS: ORAL_TABLET | ORAL | 0 refills | Status: AC

## 2022-07-28 NOTE — Addendum Note (Signed)
Addended by: Nilda Riggs on: 07/28/2022 01:34 PM   Modules accepted: Orders

## 2022-07-28 NOTE — Telephone Encounter (Signed)
Left a message for the pt to return my call.
